# Patient Record
Sex: Female | Born: 1985 | Race: White | Hispanic: No | Marital: Married | Smoking: Current every day smoker
Health system: Southern US, Community
[De-identification: ages and names within clinical notes are randomized; demographics above are authoritative.]

## PROBLEM LIST (undated history)

## (undated) DIAGNOSIS — D649 Anemia, unspecified: Secondary | ICD-10-CM

## (undated) DIAGNOSIS — J45909 Unspecified asthma, uncomplicated: Secondary | ICD-10-CM

## (undated) DIAGNOSIS — I4510 Unspecified right bundle-branch block: Secondary | ICD-10-CM

## (undated) DIAGNOSIS — Q248 Other specified congenital malformations of heart: Secondary | ICD-10-CM

## (undated) DIAGNOSIS — F329 Major depressive disorder, single episode, unspecified: Secondary | ICD-10-CM

## (undated) DIAGNOSIS — I471 Supraventricular tachycardia, unspecified: Secondary | ICD-10-CM

## (undated) DIAGNOSIS — Z8659 Personal history of other mental and behavioral disorders: Secondary | ICD-10-CM

## (undated) DIAGNOSIS — T4145XA Adverse effect of unspecified anesthetic, initial encounter: Secondary | ICD-10-CM

## (undated) DIAGNOSIS — R0600 Dyspnea, unspecified: Secondary | ICD-10-CM

## (undated) DIAGNOSIS — N939 Abnormal uterine and vaginal bleeding, unspecified: Secondary | ICD-10-CM

## (undated) HISTORY — DX: Personal history of other mental and behavioral disorders: Z86.59

## (undated) HISTORY — PX: TUBAL LIGATION: SHX77

## (undated) HISTORY — PX: WISDOM TOOTH EXTRACTION: SHX21

## (undated) HISTORY — DX: Supraventricular tachycardia: I47.1

## (undated) HISTORY — DX: Supraventricular tachycardia, unspecified: I47.10

---
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
## 2004-12-11 ENCOUNTER — Ambulatory Visit (HOSPITAL_COMMUNITY): Admission: RE | Admit: 2004-12-11 | Discharge: 2004-12-11 | Payer: Self-pay | Admitting: Obstetrics and Gynecology

## 2005-01-07 ENCOUNTER — Other Ambulatory Visit: Admission: RE | Admit: 2005-01-07 | Discharge: 2005-01-07 | Payer: Self-pay | Admitting: Obstetrics and Gynecology

## 2005-02-25 ENCOUNTER — Encounter (HOSPITAL_COMMUNITY): Admission: RE | Admit: 2005-02-25 | Discharge: 2005-03-11 | Payer: Self-pay | Admitting: Obstetrics and Gynecology

## 2005-03-18 ENCOUNTER — Encounter (HOSPITAL_COMMUNITY): Admission: RE | Admit: 2005-03-18 | Discharge: 2005-04-17 | Payer: Self-pay | Admitting: Obstetrics and Gynecology

## 2005-04-22 ENCOUNTER — Encounter (HOSPITAL_COMMUNITY): Admission: RE | Admit: 2005-04-22 | Discharge: 2005-05-22 | Payer: Self-pay | Admitting: Obstetrics and Gynecology

## 2005-05-27 ENCOUNTER — Encounter (HOSPITAL_COMMUNITY): Admission: RE | Admit: 2005-05-27 | Discharge: 2005-06-16 | Payer: Self-pay | Admitting: Obstetrics and Gynecology

## 2005-06-17 ENCOUNTER — Inpatient Hospital Stay (HOSPITAL_COMMUNITY): Admission: AD | Admit: 2005-06-17 | Discharge: 2005-06-17 | Payer: Self-pay | Admitting: Obstetrics and Gynecology

## 2005-07-06 ENCOUNTER — Inpatient Hospital Stay (HOSPITAL_COMMUNITY): Admission: AD | Admit: 2005-07-06 | Discharge: 2005-07-08 | Payer: Self-pay | Admitting: Obstetrics and Gynecology

## 2006-11-04 ENCOUNTER — Encounter: Payer: Self-pay | Admitting: Obstetrics & Gynecology

## 2006-11-05 ENCOUNTER — Ambulatory Visit: Payer: Self-pay | Admitting: Cardiology

## 2006-11-05 ENCOUNTER — Inpatient Hospital Stay (HOSPITAL_COMMUNITY): Admission: EM | Admit: 2006-11-05 | Discharge: 2006-11-05 | Payer: Self-pay | Admitting: Internal Medicine

## 2006-12-23 ENCOUNTER — Ambulatory Visit (HOSPITAL_COMMUNITY): Admission: RE | Admit: 2006-12-23 | Discharge: 2006-12-23 | Payer: Self-pay | Admitting: Obstetrics

## 2007-07-05 ENCOUNTER — Inpatient Hospital Stay (HOSPITAL_COMMUNITY): Admission: AD | Admit: 2007-07-05 | Discharge: 2007-07-08 | Payer: Self-pay | Admitting: Obstetrics

## 2007-07-07 ENCOUNTER — Encounter (INDEPENDENT_AMBULATORY_CARE_PROVIDER_SITE_OTHER): Payer: Self-pay | Admitting: Obstetrics

## 2007-07-11 ENCOUNTER — Inpatient Hospital Stay (HOSPITAL_COMMUNITY): Admission: AD | Admit: 2007-07-11 | Discharge: 2007-07-12 | Payer: Self-pay | Admitting: Obstetrics

## 2008-11-08 ENCOUNTER — Emergency Department (HOSPITAL_COMMUNITY): Admission: EM | Admit: 2008-11-08 | Discharge: 2008-11-08 | Payer: Self-pay | Admitting: Emergency Medicine

## 2010-07-08 ENCOUNTER — Encounter: Payer: Self-pay | Admitting: Obstetrics and Gynecology

## 2010-10-30 NOTE — Op Note (Signed)
NAMEDONELLA, PASCARELLA          ACCOUNT NO.:  [id number]   MEDICAL RECORD NO.:  [id number]          PATIENT TYPE:  INP   LOCATION:  9126                          FACILITY:  [facility]   PHYSICIAN:  Kathreen Cosier, M.D.DATE OF BIRTH:  Feb 26, 1986   DATE OF PROCEDURE:  07/07/2007  DATE OF DISCHARGE:                               OPERATIVE REPORT   PREOPERATIVE DIAGNOSIS:  Multiparity.   POSTOPERATIVE DIAGNOSIS:  Multiparity.   PROCEDURE:  Postpartum tubal ligation.   DESCRIPTION OF PROCEDURE:  Under general anesthesia with the patient in  the supine position, the abdomen prepped and draped.  The bladder  emptied with a straight catheter.   Midline subumbilical incision 1 inch long made and carried down to the  fascia.  Fascia cleaned, grasped with two Kochers.  The fascia and the  peritoneum opened with the Mayo scissors.  Left tube grasped in the  midportion with a Babcock clamp.  The tube traced to the fimbria.  A 0  plain suture placed in the mesosalpinx below the portion of tube within  clamp.  This was tied and approximately 1 inch of tube transected.  Hemostasis was satisfactory.  The procedure was done in a similar  fashion on the other side.  Blood loss was minimal.  Abdomen closed in  layers, peritoneum and fascia continuous suture of 0 Dexon and the skin  closed with subcuticular stitch of 4-0 Monocryl.   The patient tolerated the procedure well and was taken to the recovery  room in good condition.           ______________________________  Kathreen Cosier, M.D.     BAM/MEDQ  D:  07/07/2007  T:  07/07/2007  Job:  [id number]

## 2010-10-30 NOTE — H&P (Signed)
Stephanie Peterson, PETREY          ACCOUNT NO.:  [id number]   MEDICAL RECORD NO.:  [id number]          PATIENT TYPE:  INP   LOCATION:  3733                         FACILITY:  [facility]   PHYSICIAN:  Vernice Jefferson, MD          DATE OF BIRTH:  [birth year]   DATE OF ADMISSION:  11/05/2006  DATE OF DISCHARGE:                              HISTORY & PHYSICAL   CHIEF COMPLAINT:  Palpitations and chest pain.   HISTORY OF PRESENT ILLNESS:  The patient is a 25 year old white female  currently pregnant at [redacted] weeks and G4, P2, who has had no prior cardiac  past medical history who states she was today watching TV with her kids.  She had a sudden onset of palpitations, shortness of breath, as well as  some chest pain. The patient felt that her heart was pounding out of her  chest. Came here in the ED at [facility] and found to have a  narrow complex tachycardia with rates about 190 and regular. Rhythm  after 6 mg of adenosine x1 and 12 mg of adenosine x2 finally converted  her to normal sinus rhythm. The patient has never had this complaint  before and no family members with any arrhythmias as well. No early  sudden cardiac death. The patient has never had any dizziness or syncope  recently.   SOCIAL HISTORY:  Negative x3 habits. She is married with 2 kids.   FAMILY HISTORY:  Is reviewed and is noncontributory to patient's current  illness. As above, she has no sudden cardiac death in the family. No  history of any arrhythmias in the family.   MEDICATIONS:  None.   ALLERGIES:  TO PENICILLIN AND MOXIFLOXACIN.   REVIEW OF SYSTEMS:  Negative 11-point review of systems except otherwise  dictated in the above HPI.   PHYSICAL EXAMINATION:  Blood pressure 120s/80s; during her SVT and post  SVT 130/76. Heart rate has ranged from 192 to 111.  GENERAL:  Well-developed, well-nourished, obese, white female in no  acute distress.  HEENT:  Moist mucous membranes. No scleral icterus. No conjunctivae  pallor.  NECK:  Supple. Full range of motion. No jugular venous distention.  CARDIOVASCULAR:  Regular rate and rhythm without rubs, murmurs or  gallops.  CHEST:  Clear to auscultation bilaterally. No wheezes, rales, rhonchi.  ABDOMEN:  Soft, nontender, nondistended. Normal active bowel sounds.  EXTREMITIES:  Trace edema.  NEUROLOGICAL EXAM:  Nonfocal.   EKG on first run demonstrates a narrow complex tachycardia with probable  retrograde T waves. After adenosine, the patient had a quick run of what  was likely aberrancy with a right bundle branch block pattern and then  normal sinus rhythm. Her baseline EKG demonstrates normal sinus rhythm  with a normal PR interval; no delta wave was noted; essentially a normal  ECG.   IMPRESSION:  1. Supraventricular tachycardia, likely AVNRT. No evidence of any      preexcitation on baseline ECG.  2. The patient tolerated this tachycardia pretty well with minimal      drop in blood pressure during  this. I feel she is probably  pretty      stable. However, will admit her just to monitor on telemetry      overnight. We will check a 2-D echocardiogram in the a.m. just to      rule out any structural heart disease, check a TSH as well as just      some basic labs. I have discussed with her OB/GYN about starting a      low-dose beta blocker, and OB/GYN prefer labetalol. We will start      on 100 b.i.d., and the patient will likely be able to be discharged      in the morning. The patient is to follow up with Dr. Graciela Husbands for this      in the a.m.      Vernice Jefferson, MD  Electronically Signed     JT/MEDQ  D:  11/04/2006  T:  11/05/2006  Job:  [id number]

## 2010-10-30 NOTE — Discharge Summary (Signed)
Stephanie Peterson, MILLIKAN          ACCOUNT NO.:  [id number]   MEDICAL RECORD NO.:  [id number]          PATIENT TYPE:  INP   LOCATION:  3733                         FACILITY:  [facility]   PHYSICIAN:  Everardo Beals. Juanda Chance, MD, FACCDATE OF BIRTH:  March 19, 1986   DATE OF ADMISSION:  11/05/2006  DATE OF DISCHARGE:  11/05/2006                               DISCHARGE SUMMARY   PRIMARY CARDIOLOGIST:  Patient is new to [facility].   OB:  [facility] and Gynecology.   PRINCIPAL DIAGNOSIS:  Supraventricular tachycardia.   SECONDARY DIAGNOSES:  Pregnancy (patient thinks approximately six  weeks).   HISTORY OF PRESENT ILLNESS:  Patient is a 25 year old Caucasian female  with no past medical history.  She is gravida 4, para 2 and is currently  six weeks pregnant.  On May 20, she developed palpitations with left-  sided chest discomfort that occurred while watching TV.  Secondary to  persistence of symptoms, she presented to the [facility] ED, where she  was found to be in a narrow complex supraventricular tachycardia at a  rate of 190 beats per minute.  She was treated with 12 mg of adenosine  with restoration of sinus rhythm.  She was admitted for further  evaluation.   HOSPITAL COURSE:  Patient was initiated on labetalol at 100 mg b.i.d.  She has had no additional episodes of SVT; however, her blood pressure  has been running in the high 90s to low 100s.  A 2D echocardiogram was  performed, which revealed an EF of 65%, without regional wall motion  abnormalities, as well as normal aortic and mitral valves.   Also of note, the patient's beta HCG was 914, which was felt to be low  for her stated duration of pregnancy.  A transvaginal ultrasound was  completed and, at this point, showed no evidence of intrauterine or  extrauterine pregnancy with a very small amount of nonspecific pre-  pelvic fluid.  It has been recommended that patient follow up with  obstetrics and gynecology  this week.  She has not yet seen OB-GYN.   DISCHARGE LABORATORY DATA:  Hemoglobin 11.8, hematocrit 35.2, WBC 10.8,  platelets 255, MCV 88.  Sodium 139, potassium 3.6, chloride 111, CO2 23,  BUN 9, creatinine 0.56, glucose 93.  Total bilirubin 0.4, alkaline  phosphatase 37, AST 20, ALT 22, albumin 2.8, calcium 8.5.  Urinalysis  was negative.  TSH is currently pending.  Beta HCG was 914.   DISPOSITION:  Patient is being discharged home today, in good condition.   FOLLOWUP PLANS AND APPOINTMENTS:  [name] has been recommended that  she follow up with obstetrics and gynecology this week.  We have offered  a followup appointment with [facility].  However, because she  does not have insurance, she would prefer to just contact us p.r.n.   DISCHARGE MEDICATIONS:  None at this time, as the patient has had only  one episode of SVT and has had no prior episodes of SVT with any other  pregnancies, and also considering that she is experiencing low blood  pressures with beta blocker therapy, we will hold off  on any beta  blocker therapy at this point.  If she were to have recurrent symptoms,  we would then have to reconsider.   OUTSTANDING LABORATORY STUDIES:  TSH is pending.   DURATION OF DISCHARGE ENCOUNTER:  Forty-five minutes, including  physician time.      Stephanie Peterson, ANP      Bruce R. Juanda Chance, MD, [facility]  Electronically Signed    CB/MEDQ  D:  11/05/2006  T:  11/05/2006  Job:  [id number]   cc:   and Gynecology [facility]

## 2010-11-02 NOTE — H&P (Signed)
Stephanie Peterson, Stephanie Peterson          ACCOUNT NO.:  [id number]   MEDICAL RECORD NO.:  [id number]          PATIENT TYPE:  INP   LOCATION:  9163                          FACILITY:  [facility]   PHYSICIAN:  Osborn Coho, M.D.   DATE OF BIRTH:  [birth year]   DATE OF ADMISSION:  07/06/2005  DATE OF DISCHARGE:                                HISTORY & PHYSICAL   HISTORY OF PRESENT ILLNESS:  This is a 25 year old gravida 3, para 0, 1, 1,  1 at 36-5/7 weeks who presents with complaints of gush of clear fluid at  12:45 (midnight) with onset of contractions after that. Cervix was 2 cm in  the office earlier today (followed by the nurse midwife service and  remarkable for:  1.  First trimester spotting.  2.  Abnormal Pap with cryosurgery.  3.  History of asthma.  4.  History of preterm delivery at 34 weeks.  5.  Previous smoker.  6.  Obesity.  7.  Polyhydramnios.  8.  Group B strep negative.   ALLERGIES:  PENICILLIN AND AMOXICILLIN.   OBSTETRIC HISTORY:  The patient had a vaginal delivery in 2004 of a female  infant at 34-weeks gestation, weighing 5 pounds, 3 ounces. Remarkable for  rapid labor and preterm labor. The patient was on bedrest for one month  prior to delivery. She had a spontaneous abortion in 2005 in the first  trimester.   PAST MEDICAL HISTORY:  1.  Remarkable for anemia.  2.  History of postpartum depression.  3.  History of abnormal Pap with cryosurgery.  4.  History of childhood varicella.  5.  History of asthma diagnosed as an infant.  6.  History of kidney infection in 2004.  7.  History of depression.   FAMILY HISTORY:  Remarkable for a grandmother and grandfather with heart  disease. Grandfather with hypertension. Mother with varicose veins and blood  clots. Grandfather with emphysema. Grandmother with thyroid problems. Two  brothers with seizure disorder, and a mother with migraines. Grandfather  with cancer.   PAST SURGICAL HISTORY:  Remarkable for cryosurgery.   GENETIC HISTORY:  Remarkable for father of the baby who is a twin.   SOCIAL HISTORY:  The patient is married to Hannah Beat who is involved  and supportive. She does not report a religious affiliation. She denies any  alcohol, tobacco or drug use.   PRENATAL LABS:  Hemoglobin 10.8, platelets 242,000. Blood type A positive,  antibody screen negative.  RPR nonreactive. Rubella immune. Hepatitis negative. HIV negative.  Gonorrheal negative, Chlamydia negative.   HISTORY OF CURRENT PREGNANCY:  The patient entered care at [redacted] weeks  gestation. She had an ultrasound for cervical length in early part of  pregnancy and Delalutin was discussed and was begun after 16 weeks. She had  an anatomy scan at 18 weeks which was normal. She had a Glucola at 22 and at  26 weeks which were normal. She had some abdominal cramping at 26 weeks and  fetal fibromectomy was done which was found to be negative. She had another  ultrasound at 28 weeks which showed elevated amniotic fluid index. 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 She had several more ultrasounds which measured polyhydramnios.  She had a 3-hour GTT due to heavy polyhydramnios and it was normal. She had  a group B strep negative at term.   PHYSICAL EXAMINATION:  VITAL SIGNS:  Stable, afebrile.  HEENT:  Within normal limits.  NECK:  Thyroid normal, not enlarged.  CHEST:  Clear to auscultation.  HEART:  Regular rate and rhythm.  ABDOMEN:  Gravid. Vertex per Leopold's. EFM. She has reassuring fetal heart  rate in the 140's with contractions every 2 to 4 minutes. Clear fluid is  leaking from the vagina.  CERVIX:  Is 2 to 3 cm, 90% effaced, -1 station.  EXTREMITIES:  Within normal limits.   ASSESSMENT:  1.  Intrauterine pregnancy at 36-5/7 weeks.  2.  Premature rupture of membranes.  3.  Early active labor.   PLAN:  1.  Admit to birthing suite.  2.  Routine CNM orders.  3.  Patient  declines analgesia.      Marie L. Williams, C.N.M.      Osborn Coho, M.D.  Electronically Signed    MLW/MEDQ  D:  07/06/2005  T:  07/06/2005  Job:  [id number]

## 2011-01-29 ENCOUNTER — Emergency Department (HOSPITAL_COMMUNITY)
Admission: EM | Admit: 2011-01-29 | Discharge: 2011-01-29 | Discharge status: Against Medical Advice | Payer: Self-pay | Attending: Emergency Medicine | Admitting: Emergency Medicine

## 2011-01-29 DIAGNOSIS — R109 Unspecified abdominal pain: Secondary | ICD-10-CM | POA: Insufficient documentation

## 2011-01-29 DIAGNOSIS — R110 Nausea: Secondary | ICD-10-CM | POA: Insufficient documentation

## 2011-03-07 LAB — CBC
HCT: 34.5 — ABNORMAL LOW
HCT: 37.2
Hemoglobin: 11.8 — ABNORMAL LOW
MCHC: 34.3
MCV: 87.5
Platelets: 235
RBC: 4.31
RDW: 13.9
WBC: 13.1 — ABNORMAL HIGH

## 2011-03-07 LAB — RPR: RPR Ser Ql: NONREACTIVE

## 2011-03-21 ENCOUNTER — Emergency Department (HOSPITAL_COMMUNITY): Payer: Self-pay

## 2011-03-21 ENCOUNTER — Emergency Department (HOSPITAL_COMMUNITY)
Admission: EM | Admit: 2011-03-21 | Discharge: 2011-03-22 | Disposition: A | Discharge status: Home / Self Care | Payer: Self-pay | Attending: Emergency Medicine | Admitting: Emergency Medicine

## 2011-03-21 DIAGNOSIS — R0609 Other forms of dyspnea: Secondary | ICD-10-CM | POA: Insufficient documentation

## 2011-03-21 DIAGNOSIS — J45909 Unspecified asthma, uncomplicated: Secondary | ICD-10-CM | POA: Insufficient documentation

## 2011-03-21 DIAGNOSIS — R0602 Shortness of breath: Secondary | ICD-10-CM | POA: Insufficient documentation

## 2011-03-21 DIAGNOSIS — R079 Chest pain, unspecified: Secondary | ICD-10-CM | POA: Insufficient documentation

## 2011-03-21 DIAGNOSIS — I498 Other specified cardiac arrhythmias: Secondary | ICD-10-CM | POA: Insufficient documentation

## 2011-03-21 DIAGNOSIS — R0989 Other specified symptoms and signs involving the circulatory and respiratory systems: Secondary | ICD-10-CM | POA: Insufficient documentation

## 2011-03-21 LAB — COMPREHENSIVE METABOLIC PANEL
AST: 17 U/L (ref 0–37)
Albumin: 3.5 g/dL (ref 3.5–5.2)
Alkaline Phosphatase: 54 U/L (ref 39–117)
BUN: 7 mg/dL (ref 6–23)
CO2: 27 mEq/L (ref 19–32)
Calcium: 9 mg/dL (ref 8.4–10.5)
Creatinine, Ser: 0.71 mg/dL (ref 0.50–1.10)
GFR calc Af Amer: >90 mL/min (ref >90)
Glucose, Bld: 123 mg/dL — ABNORMAL HIGH (ref 70–99)
Potassium: 3.6 mEq/L (ref 3.5–5.1)
Total Bilirubin: 0.2 mg/dL — ABNORMAL LOW (ref 0.3–1.2)
Total Protein: 6.7 g/dL (ref 6.0–8.3)

## 2011-03-21 LAB — URINALYSIS, ROUTINE W REFLEX MICROSCOPIC
Nitrite: NEGATIVE
Protein, ur: NEGATIVE mg/dL
Urobilinogen, UA: 0.2 mg/dL (ref 0.0–1.0)

## 2011-03-21 LAB — DIFFERENTIAL
Basophils Relative: 0 % (ref 0–1)
Eosinophils Absolute: 0.3 10*3/uL (ref 0.0–0.7)
Eosinophils Relative: 2 % (ref 0–5)
Lymphocytes Relative: 32 % (ref 12–46)
Lymphs Abs: 4 10*3/uL (ref 0.7–4.0)
Monocytes Absolute: 0.6 10*3/uL (ref 0.1–1.0)
Monocytes Relative: 5 % (ref 3–12)
Neutrophils Relative %: 60 % (ref 43–77)

## 2011-03-21 LAB — CBC
MCH: 30.5 pg (ref 26.0–34.0)
WBC: 12.5 10*3/uL — ABNORMAL HIGH (ref 4.0–10.5)

## 2011-03-21 LAB — RAPID URINE DRUG SCREEN, HOSP PERFORMED
Barbiturates: NOT DETECTED
Benzodiazepines: NOT DETECTED
Opiates: NOT DETECTED

## 2011-03-21 LAB — CK TOTAL AND CKMB (NOT AT ARMC): CK, MB: 2.5 ng/mL (ref 0.3–4.0)

## 2011-03-21 LAB — POCT I-STAT, CHEM 8
Calcium, Ion: 1.16 mmol/L (ref 1.12–1.32)
Chloride: 104 mEq/L (ref 96–112)

## 2011-03-21 LAB — POCT I-STAT TROPONIN I: Troponin i, poc: 0.02 ng/mL (ref 0.00–0.08)

## 2011-03-21 LAB — URINE MICROSCOPIC-ADD ON

## 2011-03-21 MED ORDER — IOHEXOL 300 MG/ML  SOLN
100.0000 mL | Freq: Once | INTRAMUSCULAR | Status: AC | PRN
  Administered 2011-03-21: 100 mL via INTRAVENOUS

## 2011-03-22 LAB — POCT PREGNANCY, URINE: Preg Test, Ur: NEGATIVE

## 2011-07-16 ENCOUNTER — Emergency Department (HOSPITAL_COMMUNITY)
Admission: EM | Admit: 2011-07-16 | Discharge: 2011-07-16 | Disposition: A | Discharge status: Home / Self Care | Payer: Self-pay | Attending: Emergency Medicine | Admitting: Emergency Medicine

## 2011-07-16 ENCOUNTER — Encounter (HOSPITAL_COMMUNITY): Payer: Self-pay | Admitting: *Deleted

## 2011-07-16 DIAGNOSIS — IMO0002 Reserved for concepts with insufficient information to code with codable children: Secondary | ICD-10-CM | POA: Insufficient documentation

## 2011-07-16 DIAGNOSIS — M7989 Other specified soft tissue disorders: Secondary | ICD-10-CM | POA: Insufficient documentation

## 2011-07-16 DIAGNOSIS — R21 Rash and other nonspecific skin eruption: Secondary | ICD-10-CM | POA: Insufficient documentation

## 2011-07-16 DIAGNOSIS — M79609 Pain in unspecified limb: Secondary | ICD-10-CM | POA: Insufficient documentation

## 2011-07-16 DIAGNOSIS — L03113 Cellulitis of right upper limb: Secondary | ICD-10-CM

## 2011-07-16 HISTORY — DX: Supraventricular tachycardia: I47.1

## 2011-07-16 MED ORDER — HYDROXYZINE HCL 25 MG PO TABS: 25.0000 mg | ORAL_TABLET | Freq: Four times a day (QID) | ORAL | Status: AC

## 2011-07-16 MED ORDER — CEPHALEXIN 500 MG PO CAPS: 500.0000 mg | ORAL_CAPSULE | Freq: Four times a day (QID) | ORAL | Status: DC

## 2011-07-16 MED ORDER — CEPHALEXIN 500 MG PO CAPS: 500.0000 mg | ORAL_CAPSULE | Freq: Four times a day (QID) | ORAL | Status: AC

## 2011-07-16 NOTE — ED Provider Notes (Signed)
Medical screening examination/treatment/procedure(s) were performed by non-physician practitioner and as supervising physician I was immediately available for consultation/collaboration. Devoria Albe, MD, [facility]   Ward Givens, MD 07/16/11 [phone]

## 2011-07-16 NOTE — ED Provider Notes (Signed)
History     CSN: [id number]  Arrival date & time 07/16/11  4098   First MD Initiated Contact with Patient 07/16/11 [phone]      Chief Complaint  Patient presents with  . Cellulitis    (Consider location/radiation/quality/duration/timing/severity/associated sxs/prior treatment) Patient is a 26 y.o. female presenting with rash. The history is provided by the patient.  Rash  This is a new problem.  Pt states she got a tattoo 5 days ago on the right upper arm. Since then redness and swelling to the tattooed area. Never had a tatoo before. States it is draining yellow drainage. Putting bacitracin on with no relief. Area is painful. No itching.                 Past Medical History  Diagnosis Date  . SVT (supraventricular tachycardia)     Past Surgical History  Procedure Date  . Tubal ligation     No family history on file.  History  Substance Use Topics  . Smoking status: Current Everyday Smoker  . Smokeless tobacco: Not on file  . Alcohol Use: No    OB History    Grav Para Term Preterm Abortions TAB SAB Ect Mult Living                  Review of Systems  Constitutional: Negative for fever and chills.  HENT: Negative.   Eyes: Negative.   Respiratory: Negative.   Cardiovascular: Negative.   Gastrointestinal: Negative.   Genitourinary: Negative.   Musculoskeletal: Negative.   Skin: Positive for color change and rash.  Neurological: Negative.   Psychiatric/Behavioral: Negative.     Allergies  Amoxicillin and Penicillins  Home Medications  No current outpatient prescriptions on file.  BP 124/61  Pulse 94  Temp(Src) 98.1 F (36.7 C) (Oral)  Resp 18  Ht 5\' 6"  (1.676 m)  Wt 198 lb (89.812 kg)  BMI 31.96 kg/m2  SpO2 96%  Physical Exam  Nursing note and vitals reviewed. Constitutional: She appears well-developed and well-nourished. No distress.  HENT:  Head: Normocephalic.  Eyes: Conjunctivae are normal.  Neck: Neck supple.  Cardiovascular: Normal rate,  regular rhythm and normal heart sounds.   Pulmonary/Chest: Effort normal and breath sounds normal. No respiratory distress.  Musculoskeletal: Normal range of motion.  Skin: Skin is warm and dry.       Large multi colored tattoo to the right upper arm. Central erythema with mild skin scabbing and yellowish discoloration. Tender to palpation. No drainage noted. Surrounding small papules with no surrounding erythema.  Psychiatric: She has a normal mood and affect.    ED Course  Procedures (including critical care time)  Right upper arm tattoo appears infected with possible allergic reaction. Will start on antibitoic and anti histamine with follow up.  No diagnosis found.    MDM         Lottie Mussel, PA 07/16/11 1034

## 2011-07-16 NOTE — ED Notes (Signed)
Pt presents to department for evaluation of possible infection to R upper arm. States she received new tattoo on Tuesday, now site is oozing yellow/green colored drainage. states swelling and increased pain to area. Pt states "I am prone to infection." denies fever. 7/10 pain at the time. She is alert and oriented x4. No signs of distress at present.

## 2011-07-16 NOTE — ED Notes (Signed)
Pt discharged home. Had no further questions. VSS

## 2011-07-16 NOTE — ED Notes (Signed)
Patient had a new tattoo placed on her right upper arm,  Thursday evening.  She noted onset of green/yellow colored drainage  Coming from the tattoo x 2 days.  Patient has tried otc antibiotic ointment w/o relief

## 2012-01-08 ENCOUNTER — Emergency Department (HOSPITAL_COMMUNITY)
Admission: EM | Admit: 2012-01-08 | Discharge: 2012-01-09 | Disposition: A | Discharge status: Home / Self Care | Payer: Self-pay | Attending: Emergency Medicine | Admitting: Emergency Medicine

## 2012-01-08 ENCOUNTER — Encounter (HOSPITAL_COMMUNITY): Payer: Self-pay | Admitting: Emergency Medicine

## 2012-01-08 DIAGNOSIS — M545 Low back pain, unspecified: Secondary | ICD-10-CM | POA: Insufficient documentation

## 2012-01-08 DIAGNOSIS — Z880 Allergy status to penicillin: Secondary | ICD-10-CM | POA: Insufficient documentation

## 2012-01-08 DIAGNOSIS — M549 Dorsalgia, unspecified: Secondary | ICD-10-CM

## 2012-01-08 DIAGNOSIS — F17200 Nicotine dependence, unspecified, uncomplicated: Secondary | ICD-10-CM | POA: Insufficient documentation

## 2012-01-08 NOTE — ED Notes (Signed)
Pt alert, nad, c/o low back pain, onset was several months ago, denies recent trauma or injury, ambulates to triage, steady gait noted

## 2012-01-09 MED ORDER — METHOCARBAMOL 500 MG PO TABS: 500.0000 mg | ORAL_TABLET | Freq: Two times a day (BID) | ORAL | Status: AC

## 2012-01-09 MED ORDER — OXYCODONE-ACETAMINOPHEN 5-325 MG PO TABS
2.0000 | ORAL_TABLET | Freq: Once | ORAL | Status: AC
  Administered 2012-01-09: 2 via ORAL

## 2012-01-09 MED ORDER — OXYCODONE-ACETAMINOPHEN 5-325 MG PO TABS
ORAL_TABLET | ORAL | Status: AC
  Filled 2012-01-09: qty 2

## 2012-01-09 MED ORDER — NAPROXEN 500 MG PO TABS: 500.0000 mg | ORAL_TABLET | Freq: Two times a day (BID) | ORAL | Status: AC
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
## 2012-01-09 NOTE — ED Provider Notes (Signed)
Medical screening examination/treatment/procedure(s) were performed by non-physician practitioner and as supervising physician I was immediately available for consultation/collaboration.   Darsi Tien L Shadaya Marschner, MD 01/09/12 0410 

## 2013-03-06 ENCOUNTER — Emergency Department (HOSPITAL_COMMUNITY)
Admission: EM | Admit: 2013-03-06 | Discharge: 2013-03-07 | Disposition: A | Discharge status: Home / Self Care | Payer: Self-pay | Attending: Emergency Medicine | Admitting: Emergency Medicine

## 2013-03-06 ENCOUNTER — Encounter (HOSPITAL_COMMUNITY): Payer: Self-pay | Admitting: *Deleted

## 2013-03-06 ENCOUNTER — Emergency Department (HOSPITAL_COMMUNITY): Payer: Self-pay

## 2013-03-06 DIAGNOSIS — Z8679 Personal history of other diseases of the circulatory system: Secondary | ICD-10-CM | POA: Insufficient documentation

## 2013-03-06 DIAGNOSIS — R109 Unspecified abdominal pain: Secondary | ICD-10-CM | POA: Insufficient documentation

## 2013-03-06 DIAGNOSIS — F17200 Nicotine dependence, unspecified, uncomplicated: Secondary | ICD-10-CM | POA: Insufficient documentation

## 2013-03-06 DIAGNOSIS — Z3202 Encounter for pregnancy test, result negative: Secondary | ICD-10-CM | POA: Insufficient documentation

## 2013-03-06 DIAGNOSIS — Z880 Allergy status to penicillin: Secondary | ICD-10-CM | POA: Insufficient documentation

## 2013-03-06 DIAGNOSIS — Z9851 Tubal ligation status: Secondary | ICD-10-CM | POA: Insufficient documentation

## 2013-03-06 DIAGNOSIS — R319 Hematuria, unspecified: Secondary | ICD-10-CM | POA: Insufficient documentation

## 2013-03-06 LAB — URINALYSIS, ROUTINE W REFLEX MICROSCOPIC
Glucose, UA: NEGATIVE mg/dL
Ketones, ur: NEGATIVE mg/dL
Protein, ur: NEGATIVE mg/dL

## 2013-03-06 LAB — CBC WITH DIFFERENTIAL/PLATELET
Hemoglobin: 14.4 g/dL (ref 12.0–15.0)
Lymphs Abs: 3.9 10*3/uL (ref 0.7–4.0)
Monocytes Relative: 7 % (ref 3–12)
Neutro Abs: 7.4 10*3/uL (ref 1.7–7.7)
Neutrophils Relative %: 59 % (ref 43–77)
RBC: 4.65 MIL/uL (ref 3.87–5.11)

## 2013-03-06 LAB — COMPREHENSIVE METABOLIC PANEL
Albumin: 3.5 g/dL (ref 3.5–5.2)
Alkaline Phosphatase: 67 U/L (ref 39–117)
BUN: 13 mg/dL (ref 6–23)
CO2: 25 mEq/L (ref 19–32)
Chloride: 94 mEq/L — ABNORMAL LOW (ref 96–112)
GFR calc Af Amer: 84 mL/min — ABNORMAL LOW (ref >90)
GFR calc non Af Amer: 72 mL/min — ABNORMAL LOW (ref >90)
Glucose, Bld: 88 mg/dL (ref 70–99)
Potassium: 2.9 mEq/L — ABNORMAL LOW (ref 3.5–5.1)
Total Bilirubin: 0.6 mg/dL (ref 0.3–1.2)

## 2013-03-06 LAB — WET PREP, GENITAL
WBC, Wet Prep HPF POC: NONE SEEN
Yeast Wet Prep HPF POC: NONE SEEN

## 2013-03-06 MED ORDER — POTASSIUM CHLORIDE CRYS ER 20 MEQ PO TBCR
40.0000 meq | EXTENDED_RELEASE_TABLET | Freq: Once | ORAL | Status: AC
  Administered 2013-03-06: 40 meq via ORAL
  Filled 2013-03-06: qty 2

## 2013-03-06 MED ORDER — FENTANYL CITRATE 0.05 MG/ML IJ SOLN
50.0000 ug | Freq: Once | INTRAMUSCULAR | Status: AC
  Administered 2013-03-06: 50 ug via INTRAVENOUS
  Filled 2013-03-06: qty 2

## 2013-03-06 MED ORDER — OXYCODONE-ACETAMINOPHEN 5-325 MG PO TABS: 1.0000 | ORAL_TABLET | ORAL | Status: DC | PRN

## 2013-03-06 MED ORDER — HYDROMORPHONE HCL PF 1 MG/ML IJ SOLN
1.0000 mg | Freq: Once | INTRAMUSCULAR | Status: AC
  Administered 2013-03-06: 1 mg via INTRAVENOUS
  Filled 2013-03-06: qty 1

## 2013-03-06 MED ORDER — CIPROFLOXACIN HCL 500 MG PO TABS: 500.0000 mg | ORAL_TABLET | Freq: Two times a day (BID) | ORAL | Status: DC

## 2013-03-06 NOTE — ED Provider Notes (Signed)
CSN: [id number]     Arrival date & time 03/06/13  1802 History   First MD Initiated Contact with Patient 03/06/13 1839     Chief Complaint  Patient presents with  . Abdominal Pain   (Consider location/radiation/quality/duration/timing/severity/associated sxs/prior Treatment) Patient is a 27 y.o. female presenting with abdominal pain.  Abdominal Pain  Pt reports about 5 days of pain in lower abdomen, mostly suprapubic, associated with pain while urinating and for a short time afterward. She has also noticed some blood in urine the last 2 days but no vaginal bleeding or discharge. She had several days of Clindamycin remaining from a previous prescription which she though was for UTI but she can't remember. She denies any fever or vomiting. Mild lower back pain.  She also reports several boils in various areas of her body, mostly in skin folds such as axillae, abdomen etc ongoing for months, comes and goes. Nothing particularly new now.   Past Medical History  Diagnosis Date  . SVT (supraventricular tachycardia)    Past Surgical History  Procedure Laterality Date  . Tubal ligation     No family history on file. History  Substance Use Topics  . Smoking status: Current Every Day Smoker  . Smokeless tobacco: Not on file  . Alcohol Use: No   OB History   Grav Para Term Preterm Abortions TAB SAB Ect Mult Living                 Review of Systems  Gastrointestinal: Positive for abdominal pain.   All other systems reviewed and are negative except as noted in HPI.   Allergies  Amoxicillin and Penicillins  Home Medications   Current Outpatient Rx  Name  Route  Sig  Dispense  Refill  . ibuprofen (ADVIL,MOTRIN) 200 MG tablet   Oral   Take 800 mg by mouth every 6 (six) hours as needed for pain.          BP 135/71  Pulse 93  Temp(Src) 98.6 F (37 C) (Oral)  Resp 20  SpO2 97%  LMP 03/15/2011 Physical Exam  Nursing note and vitals reviewed. Constitutional: She is oriented  to person, place, and time. She appears well-developed and well-nourished.  HENT:  Head: Normocephalic and atraumatic.  Eyes: EOM are normal. Pupils are equal, round, and reactive to light.  Neck: Normal range of motion. Neck supple.  Cardiovascular: Normal rate, normal heart sounds and intact distal pulses.   Pulmonary/Chest: Effort normal and breath sounds normal.  Abdominal: Bowel sounds are normal. She exhibits no distension. There is tenderness (mild suprapubic). There is no rebound and no guarding.  Musculoskeletal: Normal range of motion. She exhibits no edema and no tenderness.  Neurological: She is alert and oriented to person, place, and time. She has normal strength. No cranial nerve deficit or sensory deficit.  Skin: Skin is warm and dry. No rash noted.  Psychiatric: She has a normal mood and affect.    ED Course  Procedures (including critical care time) Labs Review Labs Reviewed  CBC WITH DIFFERENTIAL - Abnormal; Notable for the following:    WBC 12.5 (*)    All other components within normal limits  COMPREHENSIVE METABOLIC PANEL - Abnormal; Notable for the following:    Sodium 133 (*)    Potassium 2.9 (*)    Chloride 94 (*)    GFR calc non Af Amer 72 (*)    GFR calc Af Amer 84 (*)    All other components within normal  limits  URINALYSIS, ROUTINE W REFLEX MICROSCOPIC - Abnormal; Notable for the following:    APPearance HAZY (*)    Hgb urine dipstick LARGE (*)    Leukocytes, UA SMALL (*)    All other components within normal limits  WET PREP, GENITAL  GC/CHLAMYDIA PROBE AMP  URINE MICROSCOPIC-ADD ON  POCT PREGNANCY, URINE   Imaging Review No results found.  MDM  No diagnosis found.  UA with some blood but no signs of infection. Pelvic exam done, no discharge, no CMT but moderate R adnexal tenderness. No adnexal masses. Will send for [place].   Care signed out at the change of shift pending [place]. K replaced.   Nathifa Ritthaler B. Bernette Mayers, MD 03/06/13 2241

## 2013-03-06 NOTE — ED Notes (Signed)
The  Pt has had painful urination back and abd pain for 5 days.  She has taken clindamycin po without relief.  lmp 2weeks ago.  She also has bloody urine

## 2013-03-06 NOTE — ED Provider Notes (Signed)
Pt stable and feels improved US findings noted.  I spoke to radiology dr Mayford Knife, and she reports there appears to be doppler flow so less likely torsion Abdominal exam is unremarkable at this time She reports persistent dysuria.  Will start her on antibiotics Stable for d/c   Joya Gaskins, MD 03/06/13 [id number]

## 2013-03-06 NOTE — ED Notes (Signed)
Unable to discharge pt at this time, protocol to wait 30 minutes to discharge pt after given pain medication.

## 2013-03-07 LAB — GC/CHLAMYDIA PROBE AMP: CT Probe RNA: NEGATIVE

## 2013-03-09 LAB — URINE CULTURE: Colony Count: 10000

## 2013-08-06 IMAGING — CT CT ANGIO CHEST
2 of 6 series · 19 of 46 positions shown · IV contrast (omnipaque)
Comparison: None

CLINICAL DATA: Left-sided chest pain.  Rule out pulmonary embolus.
History of asthma.

CT ANGIOGRAPHY CHEST WITH CONTRAST
TECHNIQUE: Multidetector CT imaging of the chest was performed
using the standard protocol during bolus administration of
intravenous contrast.  Multiplanar CT image reconstructions
including MIPs were obtained to evaluate the vascular anatomy.
Contrast: 100mL OMNIPAQUE IOHEXOL 300 MG/ML IV SOLN

[Series 10: pulm embolism 1.0 b25f thin · axial · 0.76mm/px · z∈[-418,-174]mm · 16 of 269 slices shown]
[im 12/269  lung]
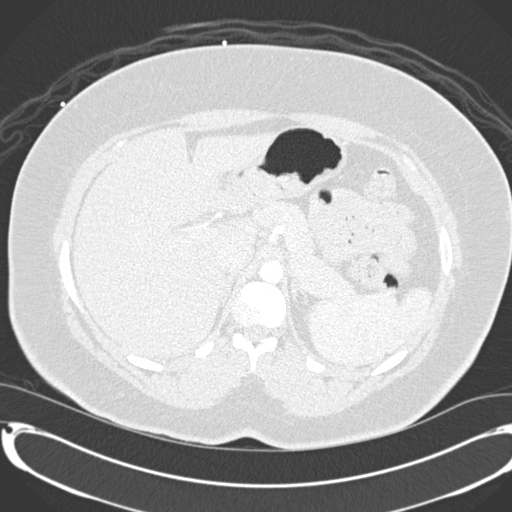
[im 35/269  soft-tissue]
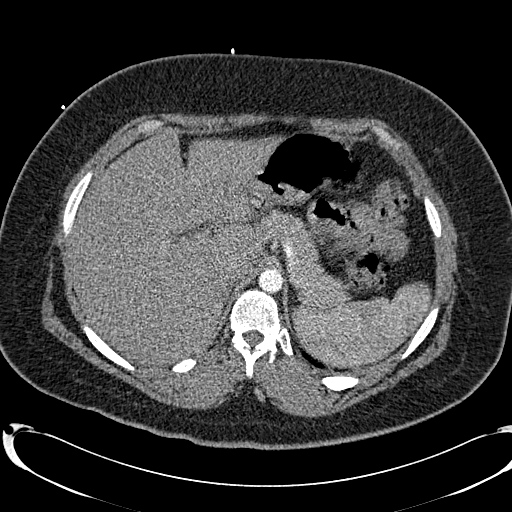
[im 47/269  lung]
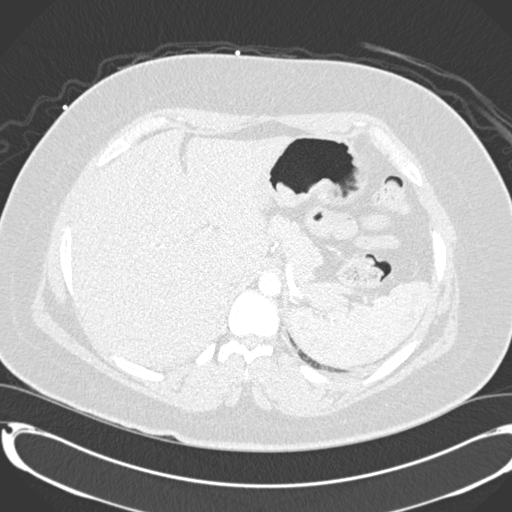
[im 59/269  soft-tissue]
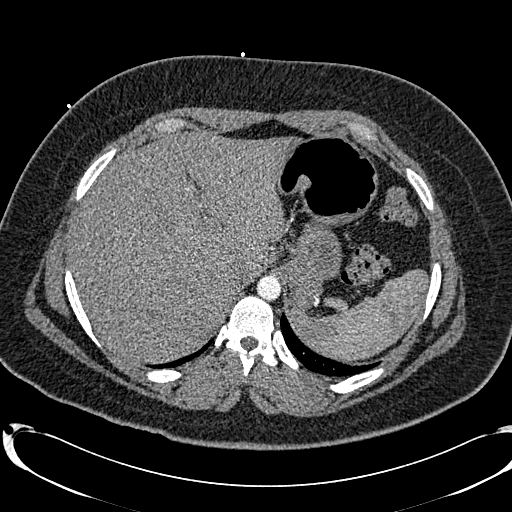
[im 82/269  lung]
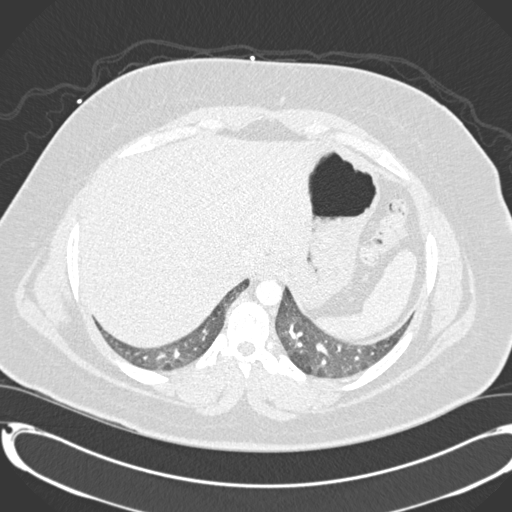
[im 94/269  soft-tissue]
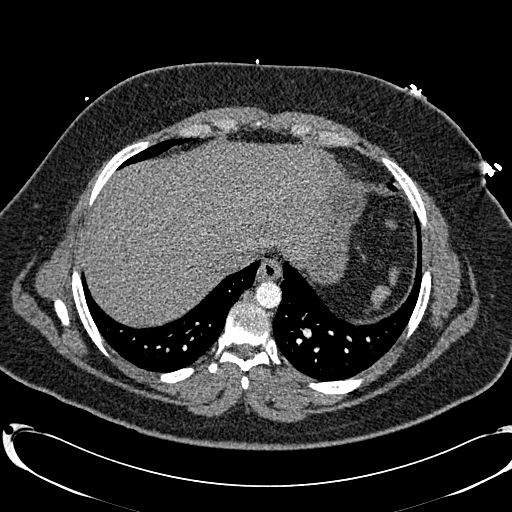
[im 105/269  lung]
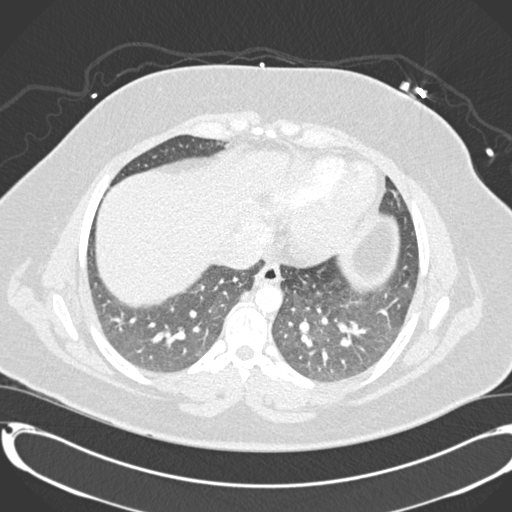
[im 129/269  soft-tissue]
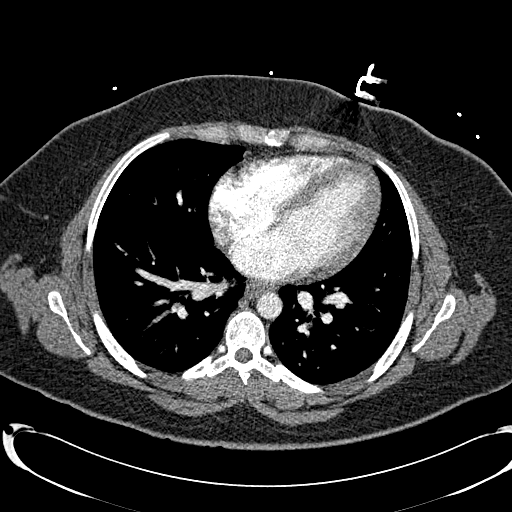
[im 140/269  lung]
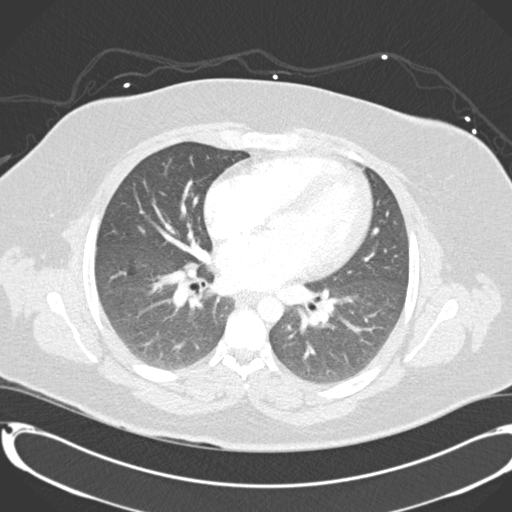
[im 164/269  soft-tissue]
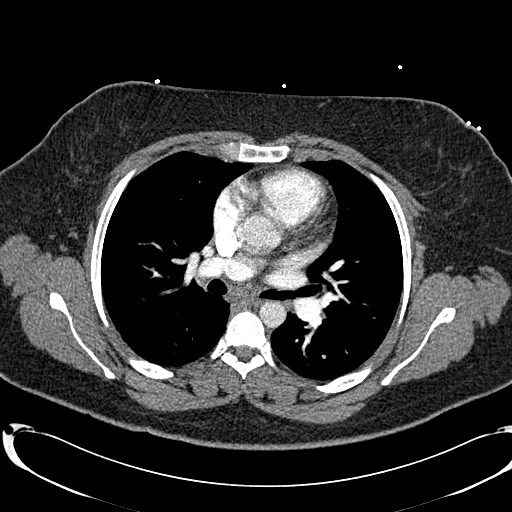
[im 175/269  lung]
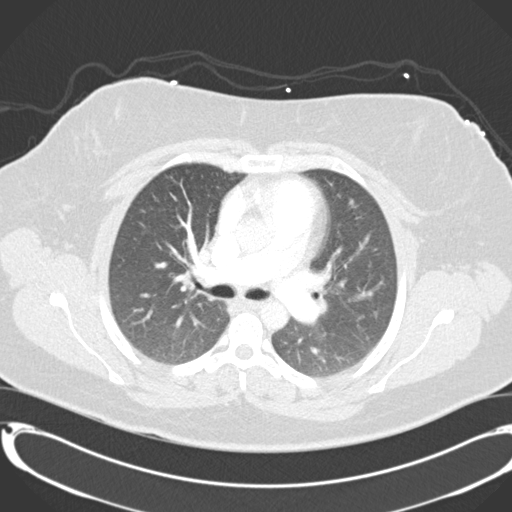
[im 187/269  soft-tissue]
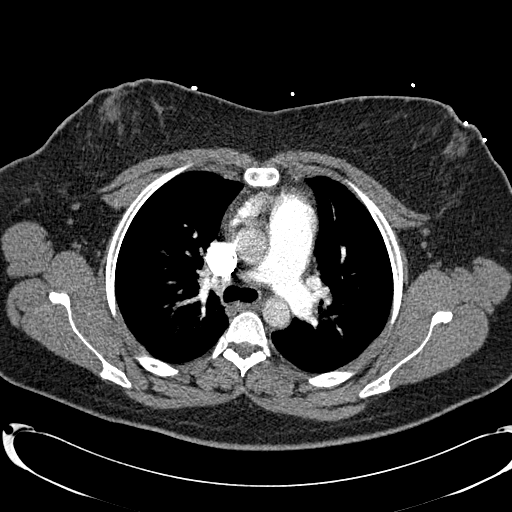
[im 210/269  lung]
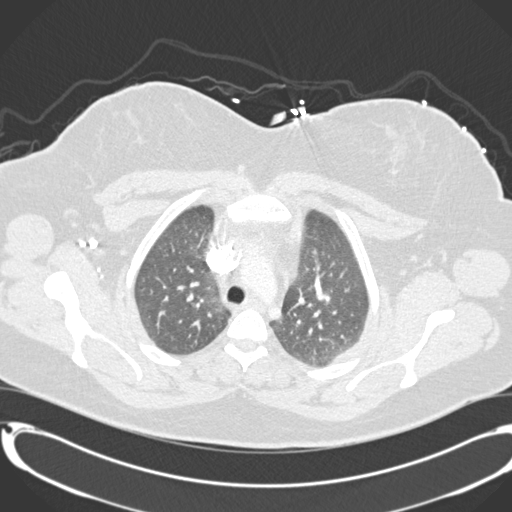
[im 222/269  soft-tissue]
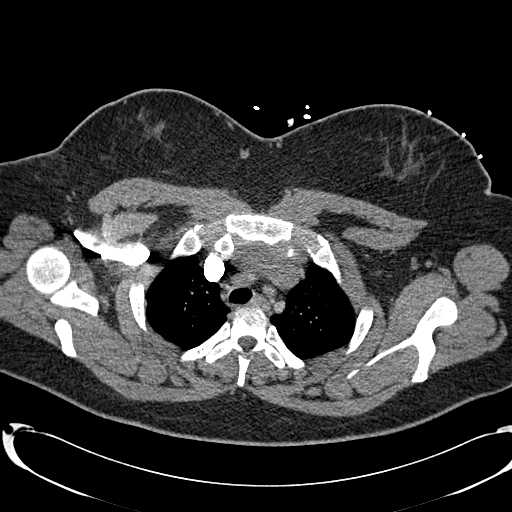
[im 234/269  lung]
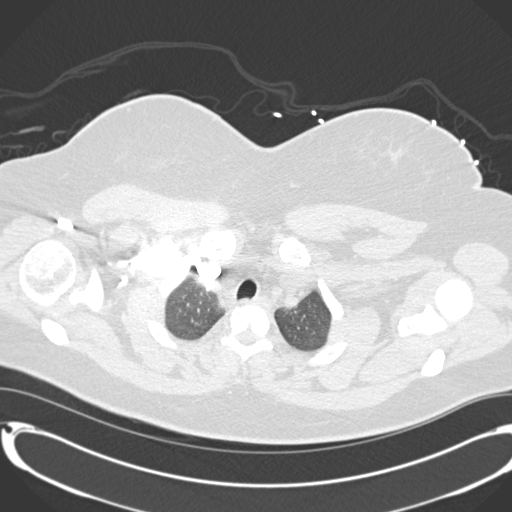
[im 257/269  soft-tissue]
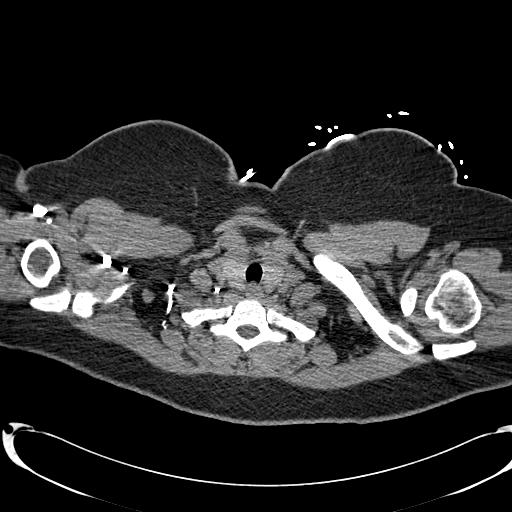

[Series 602: coronal · coronal · 0.76mm/px · 3 of 103 slices shown]
[im 26/103  soft-tissue]
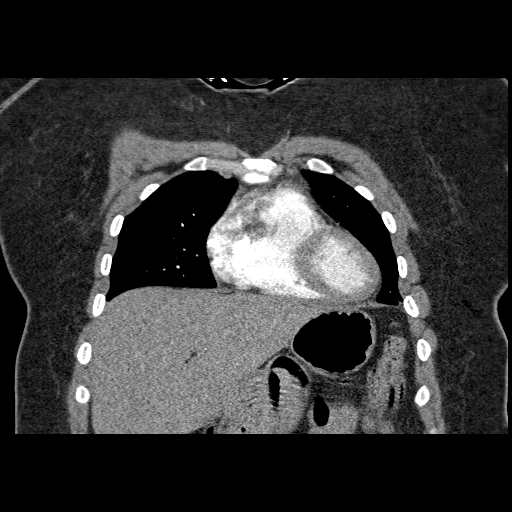
[im 52/103  soft-tissue]
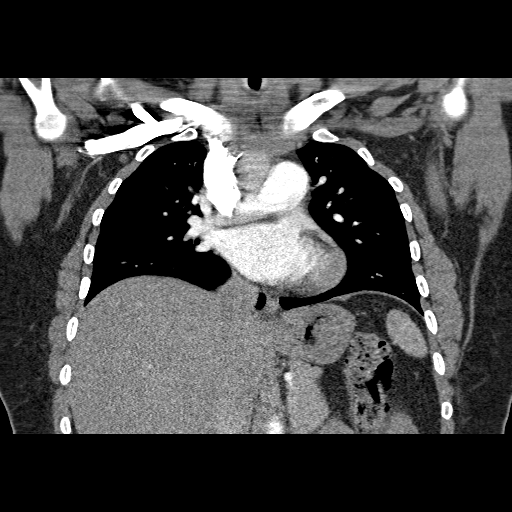
[im 77/103  soft-tissue]
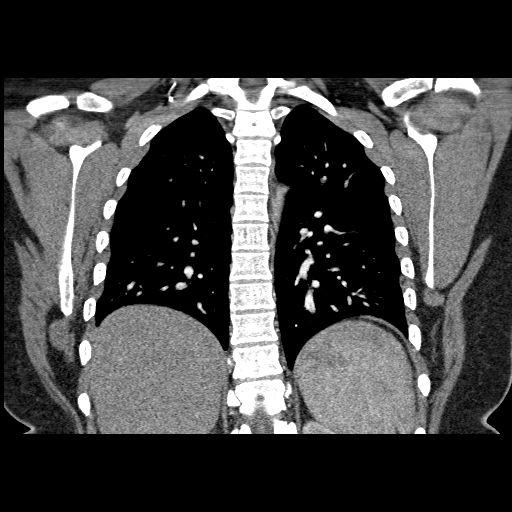

[19 of 46 positions shown; findings below may reference images not displayed]

FINDINGS: Technically adequate study with good opacification of
the central and segmental pulmonary arteries.  No focal filling
defects demonstrated.  No evidence of significant pulmonary
embolus.  Normal caliber unopacified thoracic aorta.  No pleural
effusions.  Visualized upper abdominal structures are unremarkable.
Dependent changes and respiratory motion artifact in the lungs.  No
focal airspace consolidation or interstitial changes.  Airways
appear patent.  Normal alignment of the thoracic spine.  No
depressed sternal fractures.

Review of the MIP images confirms the above findings.
IMPRESSION: No evidence of significant pulmonary embolus.

## 2015-07-23 IMAGING — US US PELVIS COMPLETE
1 series · 14 of 25 positions shown · non-contrast
Comparison: None.

CLINICAL DATA: Right pelvic pain

EXAM:
Pelvic ultrasound: Transabdominal and transvaginal
TECHNIQUE: Study was performed transabdominally to optimize pelvic field of
view evaluation transvaginally to optimize internal visceral
architecture evaluation.

[Series 1: us pelvis complete · 0.24mm/px · 14 of 56 slices shown]
[im 1/56]
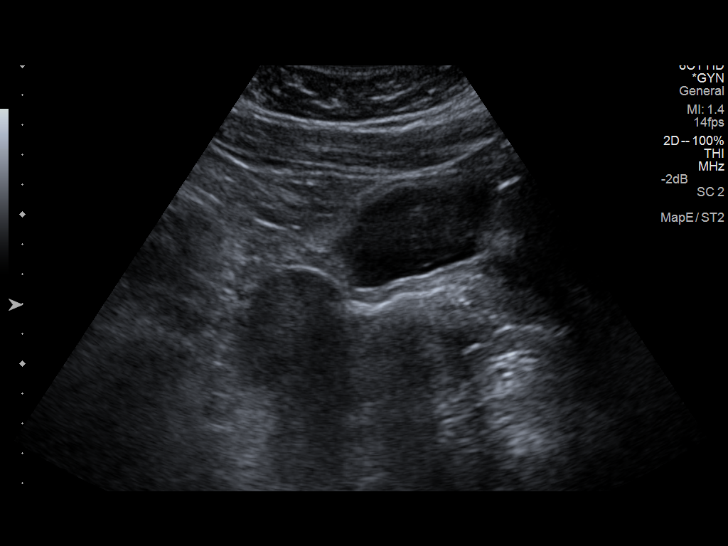
[im 5/56]
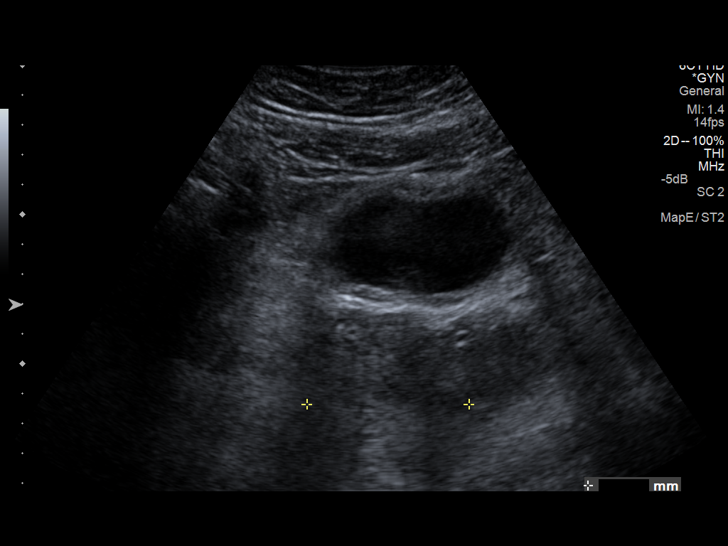
[im 10/56]
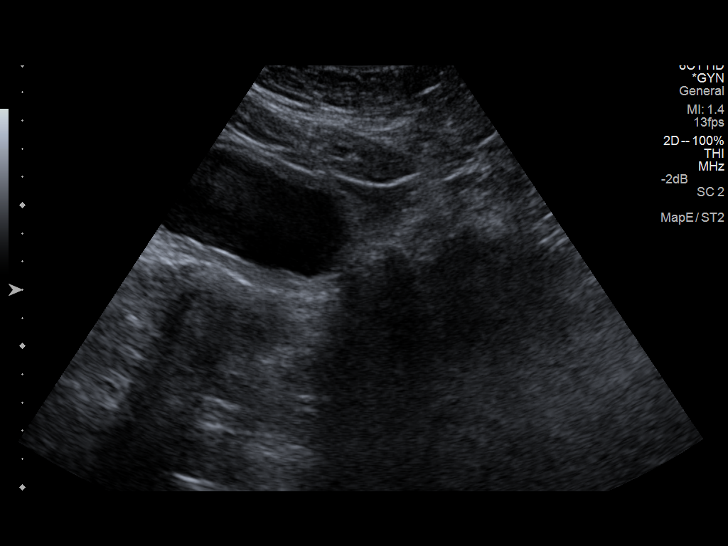
[im 14/56]
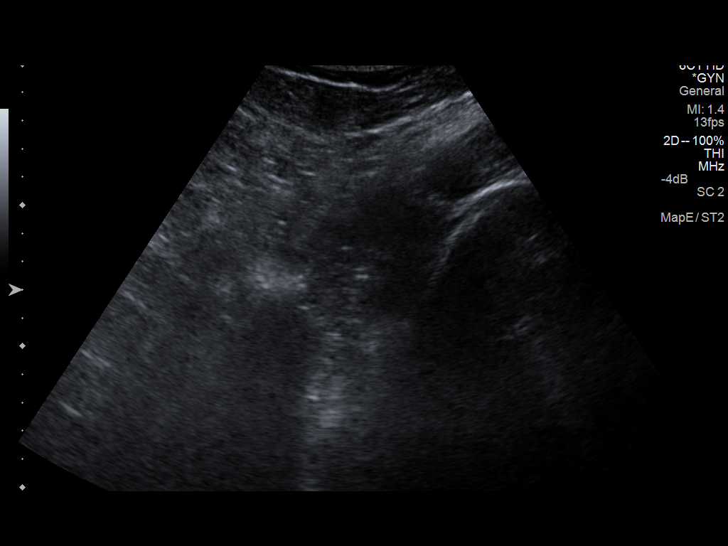
[im 19/56]
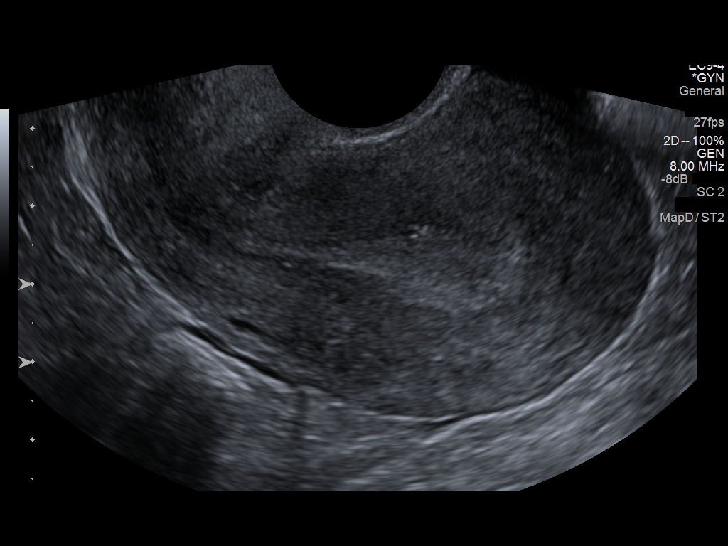
[im 21/56]
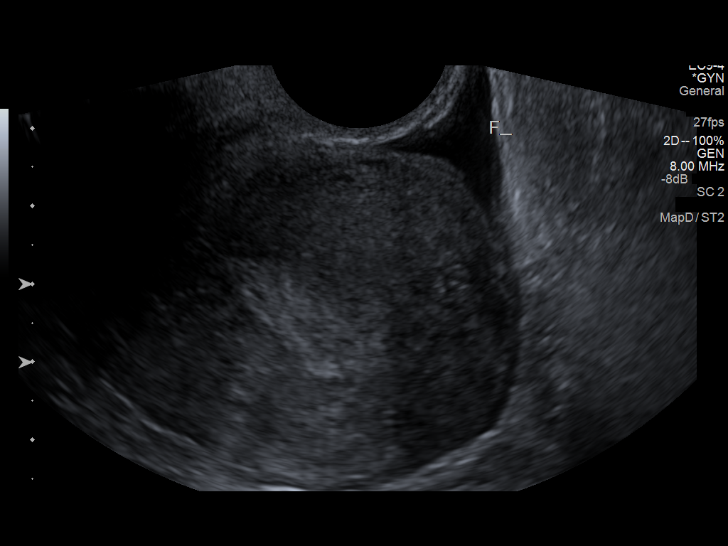
[im 26/56]
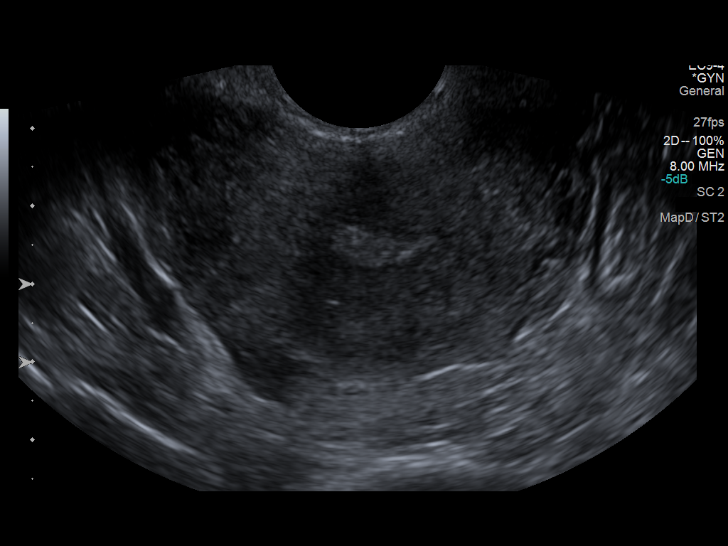
[im 30/56]
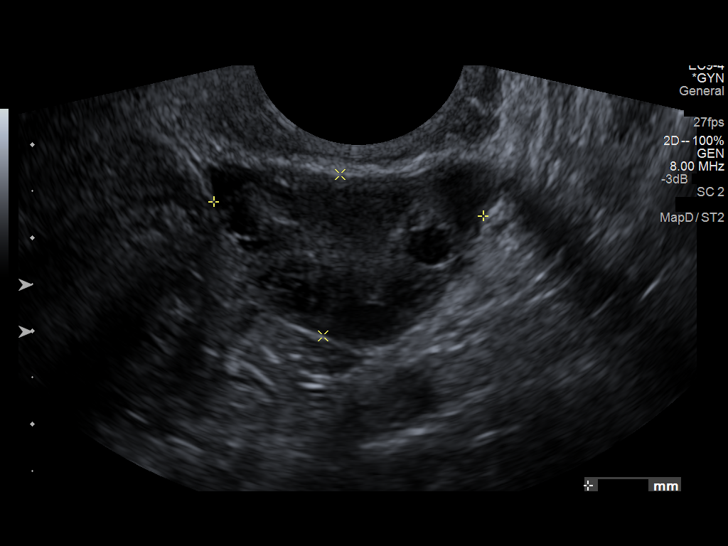
[im 35/56]
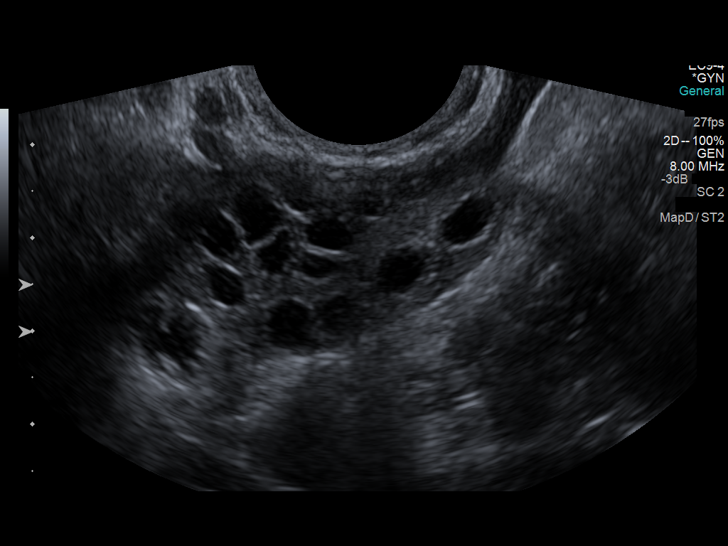
[im 37/56]
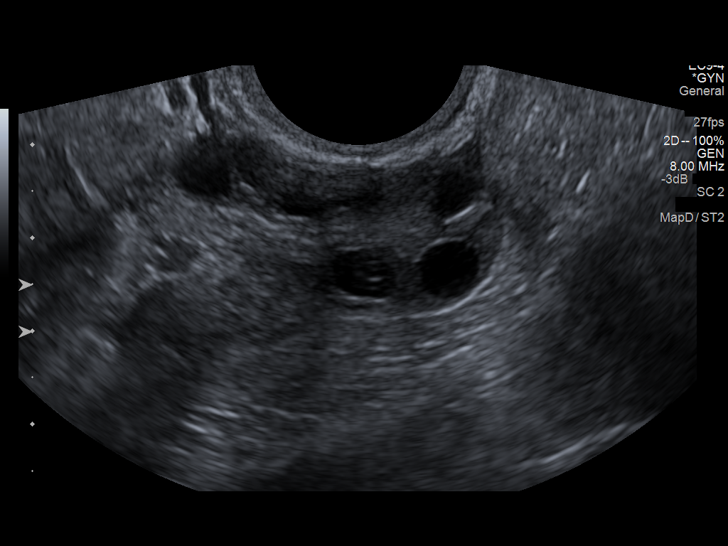
[im 42/56]
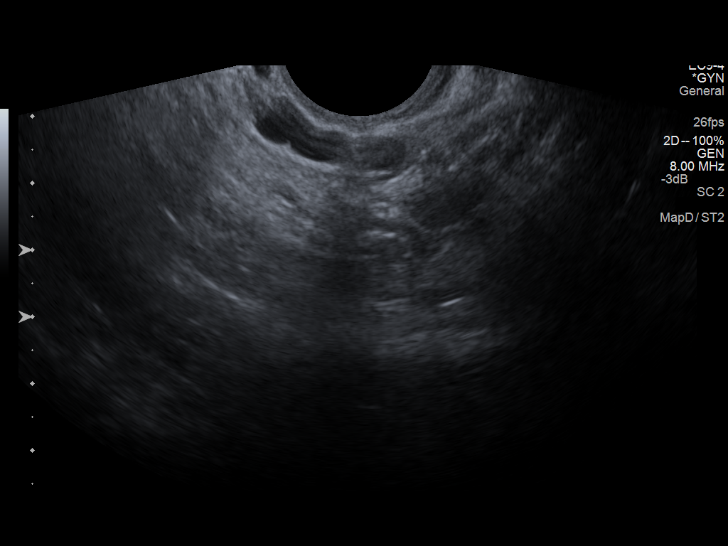
[im 46/56]
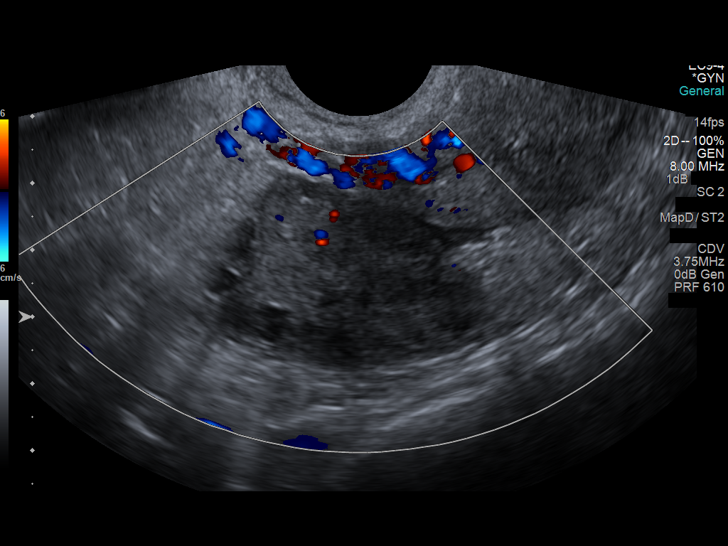
[im 51/56]
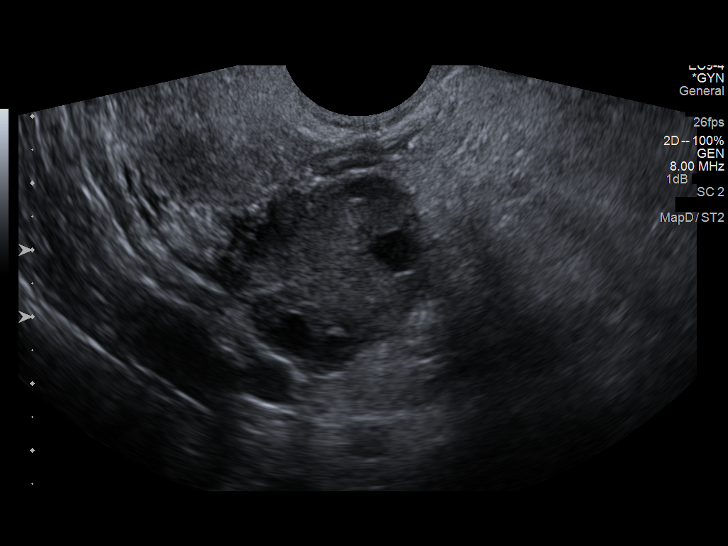
[im 56/56]
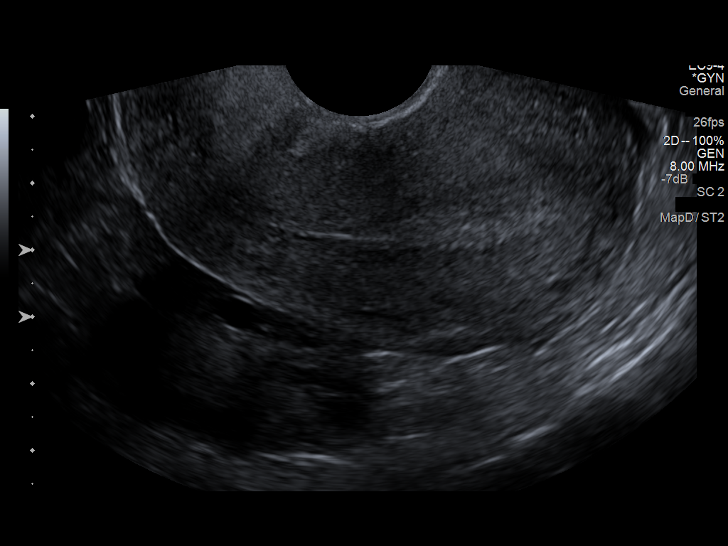

[14 of 25 positions shown; findings below may reference images not displayed]

FINDINGS: Uterus is retroverted. Uterus measures 10.6 x 4.7 x 5.7 cm. There is
no intrauterine mass. Endometrium measures 11 mm in thickness with a
smooth contour.

Right ovary measures 4.5 x 3.6 x 3.0 cm in size. Left ovary measures
2.9 x 1.7 x 4.0 cm in size. There are physiologic follicles in both
ovaries. Beyond physiologic follicles, no extrauterine pelvic masses
are identified.

There is moderate free fluid in the cul-de-sac.
IMPRESSION: Moderate free fluid in the cul-de-sac. Suspect recent ovarian cyst
rupture.

Uterus is retroverted but otherwise appears normal.

## 2016-11-05 ENCOUNTER — Encounter (HOSPITAL_COMMUNITY): Payer: Self-pay | Admitting: Emergency Medicine

## 2016-11-05 ENCOUNTER — Emergency Department (HOSPITAL_COMMUNITY): Payer: Self-pay

## 2016-11-05 DIAGNOSIS — R0602 Shortness of breath: Secondary | ICD-10-CM | POA: Insufficient documentation

## 2016-11-05 DIAGNOSIS — Z5321 Procedure and treatment not carried out due to patient leaving prior to being seen by health care provider: Secondary | ICD-10-CM | POA: Insufficient documentation

## 2016-11-05 LAB — CBC WITH DIFFERENTIAL/PLATELET
BASOS ABS: 0 10*3/uL (ref 0.0–0.1)
BASOS PCT: 0 %
EOS ABS: 0.3 10*3/uL (ref 0.0–0.7)
Eosinophils Relative: 3 %
HCT: 43.5 % (ref 36.0–46.0)
Hemoglobin: 14.6 g/dL (ref 12.0–15.0)
Lymphocytes Relative: 36 %
Lymphs Abs: 4.2 10*3/uL — ABNORMAL HIGH (ref 0.7–4.0)
MCH: 29.6 pg (ref 26.0–34.0)
MCHC: 33.6 g/dL (ref 30.0–36.0)
MCV: 88.2 fL (ref 78.0–100.0)
MONO ABS: 0.7 10*3/uL (ref 0.1–1.0)
Monocytes Relative: 6 %
Neutro Abs: 6.4 10*3/uL (ref 1.7–7.7)
Neutrophils Relative %: 55 %
Platelets: 255 10*3/uL (ref 150–400)
RBC: 4.93 MIL/uL (ref 3.87–5.11)
RDW: 13.1 % (ref 11.5–15.5)
WBC: 11.7 10*3/uL — AB (ref 4.0–10.5)

## 2016-11-05 LAB — BASIC METABOLIC PANEL
Anion gap: 7 (ref 5–15)
BUN: 7 mg/dL (ref 6–20)
CHLORIDE: 110 mmol/L (ref 101–111)
CO2: 20 mmol/L — ABNORMAL LOW (ref 22–32)
Calcium: 9.1 mg/dL (ref 8.9–10.3)
Creatinine, Ser: 0.88 mg/dL (ref 0.44–1.00)
GFR calc Af Amer: >60 mL/min (ref >60)
GLUCOSE: 105 mg/dL — AB (ref 65–99)
POTASSIUM: 3.4 mmol/L — AB (ref 3.5–5.1)
Sodium: 137 mmol/L (ref 135–145)

## 2016-11-05 LAB — SALICYLATE LEVEL

## 2016-11-05 LAB — ETHANOL

## 2016-11-05 LAB — I-STAT BETA HCG BLOOD, ED (MC, WL, AP ONLY): I-stat hCG, quantitative: 13.8 m[IU]/mL — ABNORMAL HIGH (ref <5)

## 2016-11-05 LAB — ACETAMINOPHEN LEVEL

## 2016-11-05 NOTE — ED Triage Notes (Signed)
Patient reports intermittent SOB for 2 weeks with productive cough / chest congestion , denies fever or chills .

## 2016-11-06 ENCOUNTER — Emergency Department (HOSPITAL_COMMUNITY)
Admission: EM | Admit: 2016-11-06 | Discharge: 2016-11-06 | Disposition: A | Discharge status: Home / Self Care | Payer: Self-pay | Attending: Emergency Medicine | Admitting: Emergency Medicine

## 2016-11-06 HISTORY — DX: Unspecified asthma, uncomplicated: J45.909

## 2016-11-06 NOTE — ED Notes (Signed)
The pt has been here several times asking for wait time  She has to leave now because her husband has to be at work  Upset  She reports that she is still having difficulty left

## 2018-07-21 NOTE — Patient Instructions (Addendum)
[facility]  07/21/2018      Your procedure is scheduled on  07-30-2018   Report to [facility]  at  5:30A.M.   Call this number if you have problems the morning of surgery:[phone]    OUR ADDRESS IS [address], WE ARE LOCATED [facility] WITH [facility].   Remember:  NO SOLID FOOD AFTER MIDNIGHT THE NIGHT PRIOR TO SURGERY. NOTHING BY MOUTH EXCEPT CLEAR LIQUIDS UNTIL 3 HOURS PRIOR TO SCHEULED SURGERY. PLEASE FINISH ENSURE DRINK PER SURGEON ORDER 3 HOURS PRIOR TO SCHEDULED SURGERY TIME WHICH NEEDS TO BE COMPLETED AT ____4:30AM________.     Take these medicines the morning of surgery with A SIP OF WATER: ZYRTEC, NORETHINDRONE      CLEAR LIQUID DIET   Foods Allowed                                                                     Foods Excluded  Coffee and tea, regular and decaf                             liquids that you cannot  Plain Jell-O in any flavor                                             see through such as: Fruit ices (not with fruit pulp)                                     milk, soups, orange juice  Iced Popsicles                                    All solid food Carbonated beverages, regular and diet                                    Cranberry, grape and apple juices Sports drinks like Gatorade Lightly seasoned clear broth or consume(fat free) Sugar, honey syrup  Sample Menu Breakfast                                Lunch                                     Supper Cranberry juice                    Beef broth                            Chicken broth Jell-O  Grape juice                           Apple juice Coffee or tea                        Jell-O                                      Popsicle                                                Coffee or tea                        Coffee or  tea  _____________________________________________________________________     Do not wear jewelry, make-up or nail polish.  Do not wear lotions, powders, or perfumes, or deoderant.  Do not shave 48 hours prior to surgery.  Men may shave face and neck.  Do not bring valuables to the [facility].  [facility] any belongings or valuables.  Contacts, dentures or bridgework may not be worn into surgery.  Leave your suitcase in the car.  After surgery it may be brought to your room.  For patients admitted to the [facility], discharge time will be determined by your treatment team.  Patients discharged the day of surgery will not be allowed to drive home.   Special instructions: N/A  Please read over the following fact sheets that you were given:       [facility] - Preparing for [facility] Before surgery, you can play an important role.  Because skin is not sterile, your skin needs to be as free of germs as possible.  You can reduce the number of germs on your skin by washing with CHG (chlorahexidine gluconate) soap before surgery.  CHG is an antiseptic cleaner which kills germs and bonds with the skin to continue killing germs even after washing. Please DO NOT use if you have an allergy to CHG or antibacterial soaps.  If your skin becomes reddened/irritated stop using the CHG and inform your nurse when you arrive at Short Stay. Do not shave (including legs and underarms) for at least 48 hours prior to the first CHG shower.  You may shave your face/neck. Please follow these instructions carefully:  1.  Shower with CHG Soap the night before surgery and the  morning of Surgery.  2.  If you choose to wash your hair, wash your hair first as usual with your  normal  shampoo.  3.  After you shampoo, rinse your hair and body thoroughly to remove the  shampoo.                           4.  Use CHG as you would any other liquid soap.  You can apply chg directly  to the skin and wash                        Gently with a scrungie or clean washcloth.  5.  Apply the CHG Soap to your body ONLY FROM THE NECK DOWN.   Do  not use on face/ open                           Wound or open sores. Avoid contact with eyes, ears mouth and genitals (private parts).                       Wash face,  Genitals (private parts) with your normal soap.             6.  Wash thoroughly, paying special attention to the area where your surgery  will be performed.  7.  Thoroughly rinse your body with warm water from the neck down.  8.  DO NOT shower/wash with your normal soap after using and rinsing off  the CHG Soap.                9.  Pat yourself dry with a clean towel.            10.  Wear clean pajamas.            11.  Place clean sheets on your bed the night of your first shower and do not  sleep with pets. Day of Surgery : Do not apply any lotions/deodorants the morning of surgery.  Please wear clean clothes to the [facility]surgery center.  FAILURE TO FOLLOW THESE INSTRUCTIONS MAY RESULT IN THE CANCELLATION OF YOUR SURGERY PATIENT SIGNATURE_________________________________  NURSE SIGNATURE__________________________________  ________________________________________________________________________   [facility]  An incentive spirometer is a tool that can help keep your lungs clear and active. This tool measures how well you are filling your lungs with each breath. Taking long deep breaths may help reverse or decrease the chance of developing breathing (pulmonary) problems (especially infection) following:  A long period of time when you are unable to move or be active. BEFORE THE PROCEDURE   If the spirometer includes an indicator to show your best effort, your nurse or respiratory therapist will set it to a desired goal.  If possible, sit up straight or lean slightly forward. Try not to slouch.  Hold the incentive spirometer in an upright position. INSTRUCTIONS FOR USE  1. Sit  on the edge of your bed if possible, or sit up as far as you can in bed or on a chair. 2. Hold the incentive spirometer in an upright position. 3. Breathe out normally. 4. Place the mouthpiece in your mouth and seal your lips tightly around it. 5. Breathe in slowly and as deeply as possible, raising the piston or the ball toward the top of the column. 6. Hold your breath for 3-5 seconds or for as long as possible. Allow the piston or ball to fall to the bottom of the column. 7. Remove the mouthpiece from your mouth and breathe out normally. 8. Rest for a few seconds and repeat Steps 1 through 7 at least 10 times every 1-2 hours when you are awake. Take your time and take a few normal breaths between deep breaths. 9. The spirometer may include an indicator to show your best effort. Use the indicator as a goal to work toward during each repetition. 10. After each set of 10 deep breaths, practice coughing to be sure your lungs are clear. If you have an incision (the cut made at the time of surgery), support your incision when coughing by placing a pillow or rolled up towels firmly against it. Once you are able to get out of bed,  walk around indoors and cough well. You may stop using the incentive spirometer when instructed by your caregiver.  RISKS AND COMPLICATIONS  Take your time so you do not get dizzy or light-headed.  If you are in pain, you may need to take or ask for pain medication before doing incentive spirometry. It is harder to take a deep breath if you are having pain. AFTER USE  Rest and breathe slowly and easily.  It can be helpful to keep track of a log of your progress. Your caregiver can provide you with a simple table to help with this. If you are using the spirometer at home, follow these instructions: SEEK MEDICAL CARE IF:   You are having difficultly using the spirometer.  You have trouble using the spirometer as often as instructed.  Your pain medication is not giving  enough relief while using the spirometer.  You develop fever of 100.5 F (38.1 C) or higher. SEEK IMMEDIATE MEDICAL CARE IF:   You cough up bloody sputum that had not been present before.  You develop fever of 102 F (38.9 C) or greater.  You develop worsening pain at or near the incision site. MAKE SURE YOU:   Understand these instructions.  Will watch your condition.  Will get help right away if you are not doing well or get worse. Document Released: 10/14/2006 Document Revised: 08/26/2011 Document Reviewed: 12/15/2006 [facility] Patient Information 2014 [place], [place].   ________________________________________________________________________

## 2018-07-23 ENCOUNTER — Encounter (HOSPITAL_COMMUNITY): Payer: Self-pay | Admitting: Emergency Medicine

## 2018-07-23 ENCOUNTER — Encounter (HOSPITAL_COMMUNITY)
Admission: RE | Admit: 2018-07-23 | Discharge: 2018-07-23 | Disposition: A | Discharge status: Home / Self Care | Payer: BLUE CROSS/BLUE SHIELD | Source: Ambulatory Visit | Attending: Obstetrics and Gynecology | Admitting: Obstetrics and Gynecology

## 2018-07-23 ENCOUNTER — Other Ambulatory Visit: Payer: Self-pay

## 2018-07-23 DIAGNOSIS — Z01818 Encounter for other preprocedural examination: Secondary | ICD-10-CM | POA: Diagnosis not present

## 2018-07-23 DIAGNOSIS — R102 Pelvic and perineal pain: Secondary | ICD-10-CM | POA: Diagnosis not present

## 2018-07-23 DIAGNOSIS — N939 Abnormal uterine and vaginal bleeding, unspecified: Secondary | ICD-10-CM | POA: Diagnosis not present

## 2018-07-23 DIAGNOSIS — Z8679 Personal history of other diseases of the circulatory system: Secondary | ICD-10-CM | POA: Insufficient documentation

## 2018-07-23 HISTORY — DX: Abnormal uterine and vaginal bleeding, unspecified: N93.9

## 2018-07-23 HISTORY — DX: Anemia, unspecified: D64.9

## 2018-07-23 HISTORY — DX: Adverse effect of unspecified anesthetic, initial encounter: T41.45XA

## 2018-07-23 HISTORY — DX: Other specified congenital malformations of heart: Q24.8

## 2018-07-23 HISTORY — DX: Unspecified right bundle-branch block: I45.10

## 2018-07-23 HISTORY — DX: Major depressive disorder, single episode, unspecified: F32.9

## 2018-07-23 HISTORY — DX: Dyspnea, unspecified: R06.00

## 2018-07-23 LAB — BASIC METABOLIC PANEL
Anion gap: 6 (ref 5–15)
BUN: 10 mg/dL (ref 6–20)
CO2: 23 mmol/L (ref 22–32)
Calcium: 9 mg/dL (ref 8.9–10.3)
Chloride: 108 mmol/L (ref 98–111)
Creatinine, Ser: 0.66 mg/dL (ref 0.44–1.00)
GFR calc Af Amer: >60 mL/min (ref >60)
GFR calc non Af Amer: >60 mL/min (ref >60)
Glucose, Bld: 104 mg/dL — ABNORMAL HIGH (ref 70–99)
Potassium: 3.8 mmol/L (ref 3.5–5.1)
Sodium: 137 mmol/L (ref 135–145)

## 2018-07-23 LAB — CBC
HEMATOCRIT: 43.2 % (ref 36.0–46.0)
Hemoglobin: 14 g/dL (ref 12.0–15.0)
MCH: 29.4 pg (ref 26.0–34.0)
MCHC: 32.4 g/dL (ref 30.0–36.0)
MCV: 90.8 fL (ref 80.0–100.0)
NRBC: 0 % (ref 0.0–0.2)
Platelets: 266 10*3/uL (ref 150–400)
RBC: 4.76 MIL/uL (ref 3.87–5.11)
RDW: 12.8 % (ref 11.5–15.5)
WBC: 9.1 10*3/uL (ref 4.0–10.5)

## 2018-07-29 NOTE — H&P (Signed)
[facility] is an 33 y.o. female.She was seen in September as a new patient.  Has regular menses monthly, has significant pain, Ibuprofen helps some. Sexually active, some deep pain, has BTL.  Has 2-3 episodes per week of pelvic/low abdominal pain, lasts for 30 minutes or so, random.  She was put on POP, had normal pelvic ultrasound. On follow-up in January, she is having 2 menses/month and spotting with POP, still having pain with menses and random pelvic pain. She feels no improvement with POP, wants to proceed with definitive surgical therapy.  Pertinent Gynecological History: Last pap: normal Date: 02/2018 OB History: G4, P1213 SVD x 3   Menstrual History: No LMP recorded.    Past Medical History:  Diagnosis Date  . Abnormal cardiac valve    PER PATIENT SVT CAUSED BY " AN EXTRA VALVE IN THERE"   . Abnormal uterine bleeding (AUB)   . Anemia   . Asthma    SEASONAL   . Complication of anesthesia    REPORTS " I'VE WOKEN UP DURING BOTH OF MY SURGERIES"  . Depression    IN TEENS   . Dyspnea    WITH EXERTION   . Incomplete right bundle branch block    ON 2018 EKG  . SVT (supraventricular tachycardia) (HCC)    6 MONTHS AGO , WAS ABLE TO CARDIOVERT HERSELF WITH VALSALVA MANUEVER     Past Surgical History:  Procedure Laterality Date  . TUBAL LIGATION    . WISDOM TOOTH EXTRACTION      No family history on file.  Social History:  reports that she has been smoking cigarettes. She has a 4.00 pack-year smoking history. She has never used smokeless tobacco. She reports that she does not drink alcohol or use drugs.  Allergies:  Allergies  Allergen Reactions  . Amoxicillin Anaphylaxis and Hives  . Penicillins Anaphylaxis and Hives    Did it involve swelling of the face/tongue/throat, SOB, or low BP? Yes Did it involve sudden or severe rash/hives, skin peeling, or any reaction on the inside of your mouth or nose? Yes Did you need to seek medical attention at a hospital or  doctor's office? Yes When did it last happen?Childhood allergy If all above answers are "NO", may proceed with cephalosporin use.   Marland Kitchen Ultram [Tramadol] Nausea And Vomiting    Passed out    No medications prior to admission.    Review of Systems  Respiratory: Negative.   Cardiovascular: Negative.     There were no vitals taken for this visit. Physical Exam  Constitutional: She appears well-developed and well-nourished.  Neck: Neck supple. No thyromegaly present.  Cardiovascular: Normal rate, regular rhythm and normal heart sounds.  No murmur heard. Respiratory: Effort normal and breath sounds normal. No respiratory distress. She has no wheezes.  GI: Soft. She exhibits no distension and no mass. There is abdominal tenderness (mild, RLQ).  Genitourinary:    Vagina and uterus normal.     Genitourinary Comments: No adnexal mass, but some right adnexal tenderness     No results found for this or any previous visit (from the past 24 hour(s)).  No results found.  Assessment/Plan: Dysmenorrhea, pelvic pain and AUB.  Normal pelvic ultrasound, no improvement with POP.  All medical and surgical options have been discussed, she wants definitive surgical therapy.  Discussed surgical procedure, risks, chances of relieving her symptoms.  Will admit for LAVH, possible bilateral salpingectomy, possible cystoscopy.  She is on Bactrim for pre-op UTI.  Leighton Roach Tri Chittick 07/29/2018, 7:49 PM

## 2018-07-30 ENCOUNTER — Other Ambulatory Visit: Payer: Self-pay

## 2018-07-30 ENCOUNTER — Ambulatory Visit (HOSPITAL_BASED_OUTPATIENT_CLINIC_OR_DEPARTMENT_OTHER): Payer: BLUE CROSS/BLUE SHIELD | Admitting: Physician Assistant

## 2018-07-30 ENCOUNTER — Encounter (HOSPITAL_BASED_OUTPATIENT_CLINIC_OR_DEPARTMENT_OTHER): Payer: Self-pay | Admitting: Emergency Medicine

## 2018-07-30 ENCOUNTER — Ambulatory Visit (HOSPITAL_BASED_OUTPATIENT_CLINIC_OR_DEPARTMENT_OTHER)
Admission: RE | Admit: 2018-07-30 | Discharge: 2018-07-30 | Disposition: A | Discharge status: Home / Self Care | Payer: BLUE CROSS/BLUE SHIELD | Source: Ambulatory Visit | Attending: Obstetrics and Gynecology | Admitting: Obstetrics and Gynecology

## 2018-07-30 ENCOUNTER — Encounter (HOSPITAL_BASED_OUTPATIENT_CLINIC_OR_DEPARTMENT_OTHER)
Admission: RE | Disposition: A | Discharge status: Home / Self Care | Payer: Self-pay | Source: Ambulatory Visit | Attending: Obstetrics and Gynecology

## 2018-07-30 ENCOUNTER — Ambulatory Visit (HOSPITAL_BASED_OUTPATIENT_CLINIC_OR_DEPARTMENT_OTHER): Payer: BLUE CROSS/BLUE SHIELD | Admitting: Anesthesiology

## 2018-07-30 DIAGNOSIS — N838 Other noninflammatory disorders of ovary, fallopian tube and broad ligament: Secondary | ICD-10-CM | POA: Insufficient documentation

## 2018-07-30 DIAGNOSIS — N7011 Chronic salpingitis: Secondary | ICD-10-CM | POA: Diagnosis not present

## 2018-07-30 DIAGNOSIS — R102 Pelvic and perineal pain: Secondary | ICD-10-CM | POA: Insufficient documentation

## 2018-07-30 DIAGNOSIS — F17210 Nicotine dependence, cigarettes, uncomplicated: Secondary | ICD-10-CM | POA: Insufficient documentation

## 2018-07-30 DIAGNOSIS — N939 Abnormal uterine and vaginal bleeding, unspecified: Secondary | ICD-10-CM | POA: Diagnosis not present

## 2018-07-30 DIAGNOSIS — K660 Peritoneal adhesions (postprocedural) (postinfection): Secondary | ICD-10-CM | POA: Insufficient documentation

## 2018-07-30 DIAGNOSIS — N879 Dysplasia of cervix uteri, unspecified: Secondary | ICD-10-CM | POA: Diagnosis not present

## 2018-07-30 DIAGNOSIS — N946 Dysmenorrhea, unspecified: Secondary | ICD-10-CM | POA: Diagnosis present

## 2018-07-30 DIAGNOSIS — Z90710 Acquired absence of both cervix and uterus: Secondary | ICD-10-CM | POA: Diagnosis present

## 2018-07-30 HISTORY — PX: CYSTOSCOPY: SHX5120

## 2018-07-30 HISTORY — PX: LAPAROSCOPIC VAGINAL HYSTERECTOMY WITH SALPINGECTOMY: SHX6680

## 2018-07-30 LAB — POCT PREGNANCY, URINE: PREG TEST UR: NEGATIVE

## 2018-07-30 SURGERY — HYSTERECTOMY, VAGINAL, LAPAROSCOPY-ASSISTED, WITH SALPINGECTOMY
Anesthesia: General

## 2018-07-30 MED ORDER — PROPOFOL 10 MG/ML IV BOLUS
INTRAVENOUS | Status: DC | PRN
  Administered 2018-07-30: 200 mg via INTRAVENOUS

## 2018-07-30 MED ORDER — GENTAMICIN SULFATE 40 MG/ML IJ SOLN
INTRAVENOUS | Status: AC
  Administered 2018-07-30: 390 mg via INTRAVENOUS
  Filled 2018-07-30: qty 9.75

## 2018-07-30 MED ORDER — SIMETHICONE 80 MG PO CHEW
80.0000 mg | CHEWABLE_TABLET | Freq: Four times a day (QID) | ORAL | Status: DC | PRN
  Filled 2018-07-30: qty 1

## 2018-07-30 MED ORDER — PROMETHAZINE HCL 25 MG/ML IJ SOLN
6.2500 mg | INTRAMUSCULAR | Status: DC | PRN
  Filled 2018-07-30: qty 1

## 2018-07-30 MED ORDER — GABAPENTIN 300 MG PO CAPS
300.0000 mg | ORAL_CAPSULE | Freq: Three times a day (TID) | ORAL | Status: DC
  Administered 2018-07-30: 300 mg via ORAL
  Filled 2018-07-30: qty 1

## 2018-07-30 MED ORDER — KETOROLAC TROMETHAMINE 30 MG/ML IJ SOLN
INTRAMUSCULAR | Status: AC
  Filled 2018-07-30: qty 1

## 2018-07-30 MED ORDER — DEXAMETHASONE SODIUM PHOSPHATE 10 MG/ML IJ SOLN
INTRAMUSCULAR | Status: DC | PRN
  Administered 2018-07-30: 10 mg via INTRAVENOUS

## 2018-07-30 MED ORDER — SUGAMMADEX SODIUM 200 MG/2ML IV SOLN
INTRAVENOUS | Status: DC | PRN
  Administered 2018-07-30: 200 mg via INTRAVENOUS

## 2018-07-30 MED ORDER — SUGAMMADEX SODIUM 200 MG/2ML IV SOLN
INTRAVENOUS | Status: AC
  Filled 2018-07-30: qty 2

## 2018-07-30 MED ORDER — ACETAMINOPHEN 500 MG PO TABS
1000.0000 mg | ORAL_TABLET | ORAL | Status: AC
  Administered 2018-07-30: 1000 mg via ORAL
  Filled 2018-07-30: qty 2

## 2018-07-30 MED ORDER — CELECOXIB 400 MG PO CAPS
400.0000 mg | ORAL_CAPSULE | ORAL | Status: AC
  Administered 2018-07-30: 400 mg via ORAL
  Filled 2018-07-30: qty 1

## 2018-07-30 MED ORDER — GABAPENTIN 300 MG PO CAPS
300.0000 mg | ORAL_CAPSULE | ORAL | Status: AC
  Administered 2018-07-30: 300 mg via ORAL
  Filled 2018-07-30: qty 1

## 2018-07-30 MED ORDER — SCOPOLAMINE 1 MG/3DAYS TD PT72
MEDICATED_PATCH | TRANSDERMAL | Status: AC
  Filled 2018-07-30: qty 1

## 2018-07-30 MED ORDER — DEXAMETHASONE SODIUM PHOSPHATE 10 MG/ML IJ SOLN
INTRAMUSCULAR | Status: AC
  Filled 2018-07-30: qty 1

## 2018-07-30 MED ORDER — BISACODYL 10 MG RE SUPP
10.0000 mg | Freq: Every day | RECTAL | Status: DC | PRN
  Filled 2018-07-30: qty 1

## 2018-07-30 MED ORDER — MENTHOL 3 MG MT LOZG
1.0000 | LOZENGE | OROMUCOSAL | Status: DC | PRN
  Filled 2018-07-30: qty 9

## 2018-07-30 MED ORDER — GABAPENTIN 300 MG PO CAPS: 300.0000 mg | ORAL_CAPSULE | Freq: Three times a day (TID) | ORAL | 0 refills | Status: DC

## 2018-07-30 MED ORDER — ACETAMINOPHEN 160 MG/5ML PO SOLN
325.0000 mg | Freq: Once | ORAL | Status: DC
  Filled 2018-07-30: qty 20.3

## 2018-07-30 MED ORDER — ONDANSETRON HCL 4 MG/2ML IJ SOLN
INTRAMUSCULAR | Status: AC
  Filled 2018-07-30: qty 2

## 2018-07-30 MED ORDER — ACETAMINOPHEN 500 MG PO TABS
ORAL_TABLET | ORAL | Status: AC
  Filled 2018-07-30: qty 2

## 2018-07-30 MED ORDER — SODIUM CHLORIDE 0.9 % IR SOLN
Status: DC | PRN
  Administered 2018-07-30: 3000 mL

## 2018-07-30 MED ORDER — MIDAZOLAM HCL 2 MG/2ML IJ SOLN
INTRAMUSCULAR | Status: AC
  Filled 2018-07-30: qty 2

## 2018-07-30 MED ORDER — FENTANYL CITRATE (PF) 250 MCG/5ML IJ SOLN
INTRAMUSCULAR | Status: AC
  Filled 2018-07-30: qty 5

## 2018-07-30 MED ORDER — CELECOXIB 200 MG PO CAPS
ORAL_CAPSULE | ORAL | Status: AC
  Filled 2018-07-30: qty 2

## 2018-07-30 MED ORDER — BUPIVACAINE HCL (PF) 0.5 % IJ SOLN
INTRAMUSCULAR | Status: DC | PRN
  Administered 2018-07-30: 5 mL

## 2018-07-30 MED ORDER — MIDAZOLAM HCL 2 MG/2ML IJ SOLN
INTRAMUSCULAR | Status: DC | PRN
  Administered 2018-07-30: 2 mg via INTRAVENOUS

## 2018-07-30 MED ORDER — LIDOCAINE 2% (20 MG/ML) 5 ML SYRINGE
INTRAMUSCULAR | Status: DC | PRN
  Administered 2018-07-30: 1.5 mg/kg/h via INTRAVENOUS

## 2018-07-30 MED ORDER — OXYCODONE HCL 5 MG PO TABS
5.0000 mg | ORAL_TABLET | ORAL | Status: DC | PRN
  Administered 2018-07-30: 10 mg via ORAL
  Filled 2018-07-30: qty 2

## 2018-07-30 MED ORDER — KETAMINE HCL 10 MG/ML IJ SOLN
INTRAMUSCULAR | Status: DC | PRN
  Administered 2018-07-30: 40 mg via INTRAVENOUS

## 2018-07-30 MED ORDER — KETOROLAC TROMETHAMINE 30 MG/ML IJ SOLN
30.0000 mg | Freq: Once | INTRAMUSCULAR | Status: DC
  Filled 2018-07-30: qty 1

## 2018-07-30 MED ORDER — HYDROMORPHONE HCL 1 MG/ML IJ SOLN
0.2500 mg | INTRAMUSCULAR | Status: DC | PRN
  Administered 2018-07-30: 0.5 mg via INTRAVENOUS
  Filled 2018-07-30: qty 0.5

## 2018-07-30 MED ORDER — OXYCODONE HCL 5 MG PO TABS: 5.0000 mg | ORAL_TABLET | ORAL | 0 refills | Status: DC | PRN

## 2018-07-30 MED ORDER — SODIUM CHLORIDE (PF) 0.9 % IJ SOLN: INTRAMUSCULAR | Status: DC | PRN

## 2018-07-30 MED ORDER — LIDOCAINE 2% (20 MG/ML) 5 ML SYRINGE
INTRAMUSCULAR | Status: AC
  Filled 2018-07-30: qty 5

## 2018-07-30 MED ORDER — LIDOCAINE 2% (20 MG/ML) 5 ML SYRINGE
INTRAMUSCULAR | Status: DC | PRN
  Administered 2018-07-30: 50 mg via INTRAVENOUS

## 2018-07-30 MED ORDER — ROCURONIUM BROMIDE 100 MG/10ML IV SOLN
INTRAVENOUS | Status: AC
  Filled 2018-07-30: qty 1

## 2018-07-30 MED ORDER — HYDROMORPHONE HCL 1 MG/ML IJ SOLN
INTRAMUSCULAR | Status: AC
  Filled 2018-07-30: qty 1

## 2018-07-30 MED ORDER — ONDANSETRON HCL 4 MG PO TABS
4.0000 mg | ORAL_TABLET | Freq: Four times a day (QID) | ORAL | Status: DC | PRN
  Filled 2018-07-30: qty 1

## 2018-07-30 MED ORDER — ACETAMINOPHEN 325 MG PO TABS
325.0000 mg | ORAL_TABLET | Freq: Once | ORAL | Status: DC
  Filled 2018-07-30: qty 2

## 2018-07-30 MED ORDER — BUPIVACAINE HCL (PF) 0.25 % IJ SOLN
INTRAMUSCULAR | Status: DC | PRN
  Administered 2018-07-30: 13 mL

## 2018-07-30 MED ORDER — ROCURONIUM BROMIDE 10 MG/ML (PF) SYRINGE
PREFILLED_SYRINGE | INTRAVENOUS | Status: DC | PRN
  Administered 2018-07-30: 50 mg via INTRAVENOUS
  Administered 2018-07-30: 10 mg via INTRAVENOUS

## 2018-07-30 MED ORDER — MEPERIDINE HCL 25 MG/ML IJ SOLN
6.2500 mg | INTRAMUSCULAR | Status: DC | PRN
  Filled 2018-07-30: qty 1

## 2018-07-30 MED ORDER — IBUPROFEN 800 MG PO TABS
800.0000 mg | ORAL_TABLET | Freq: Four times a day (QID) | ORAL | Status: DC
  Filled 2018-07-30: qty 1

## 2018-07-30 MED ORDER — HYDROMORPHONE HCL 1 MG/ML IJ SOLN
1.0000 mg | INTRAMUSCULAR | Status: DC | PRN
  Filled 2018-07-30: qty 1

## 2018-07-30 MED ORDER — LACTATED RINGERS IV SOLN
INTRAVENOUS | Status: DC
  Filled 2018-07-30: qty 1000

## 2018-07-30 MED ORDER — DEXTROSE-NACL 5-0.45 % IV SOLN
INTRAVENOUS | Status: DC
  Administered 2018-07-30: 125 mL/h via INTRAVENOUS
  Filled 2018-07-30: qty 1000

## 2018-07-30 MED ORDER — SCOPOLAMINE 1 MG/3DAYS TD PT72
1.0000 | MEDICATED_PATCH | TRANSDERMAL | Status: DC
  Administered 2018-07-30: 1.5 mg via TRANSDERMAL
  Filled 2018-07-30: qty 1

## 2018-07-30 MED ORDER — KETAMINE HCL 10 MG/ML IJ SOLN
INTRAMUSCULAR | Status: AC
  Filled 2018-07-30: qty 1

## 2018-07-30 MED ORDER — GABAPENTIN 300 MG PO CAPS
ORAL_CAPSULE | ORAL | Status: AC
  Filled 2018-07-30: qty 1

## 2018-07-30 MED ORDER — PROPOFOL 10 MG/ML IV BOLUS
INTRAVENOUS | Status: AC
  Filled 2018-07-30: qty 40

## 2018-07-30 MED ORDER — ONDANSETRON HCL 4 MG/2ML IJ SOLN
4.0000 mg | Freq: Four times a day (QID) | INTRAMUSCULAR | Status: DC | PRN
  Administered 2018-07-30: 4 mg via INTRAVENOUS
  Filled 2018-07-30: qty 2

## 2018-07-30 MED ORDER — ARTIFICIAL TEARS OPHTHALMIC OINT
TOPICAL_OINTMENT | OPHTHALMIC | Status: AC
  Filled 2018-07-30: qty 3.5

## 2018-07-30 MED ORDER — ACETAMINOPHEN 500 MG PO TABS
1000.0000 mg | ORAL_TABLET | Freq: Four times a day (QID) | ORAL | Status: DC
  Administered 2018-07-30: 1000 mg via ORAL
  Filled 2018-07-30: qty 2

## 2018-07-30 MED ORDER — OXYCODONE HCL 5 MG PO TABS
ORAL_TABLET | ORAL | Status: AC
  Filled 2018-07-30: qty 2

## 2018-07-30 MED ORDER — LACTATED RINGERS IV SOLN
INTRAVENOUS | Status: DC
  Administered 2018-07-30 (×2): via INTRAVENOUS
  Filled 2018-07-30: qty 1000

## 2018-07-30 MED ORDER — ONDANSETRON HCL 4 MG/2ML IJ SOLN
INTRAMUSCULAR | Status: DC | PRN
  Administered 2018-07-30: 4 mg via INTRAVENOUS

## 2018-07-30 MED ORDER — FENTANYL CITRATE (PF) 100 MCG/2ML IJ SOLN
INTRAMUSCULAR | Status: DC | PRN
  Administered 2018-07-30 (×2): 50 ug via INTRAVENOUS
  Administered 2018-07-30: 100 ug via INTRAVENOUS

## 2018-07-30 MED ORDER — ACETAMINOPHEN 10 MG/ML IV SOLN
1000.0000 mg | Freq: Once | INTRAVENOUS | Status: DC | PRN
  Filled 2018-07-30: qty 100

## 2018-07-30 MED ORDER — VASOPRESSIN 20 UNIT/ML IV SOLN
INTRAVENOUS | Status: DC | PRN
  Administered 2018-07-30: 20 mL via INTRAMUSCULAR

## 2018-07-30 MED ORDER — ALUM & MAG HYDROXIDE-SIMETH 200-200-20 MG/5ML PO SUSP
30.0000 mL | ORAL | Status: DC | PRN
  Filled 2018-07-30: qty 30

## 2018-07-30 MED ORDER — FLUORESCEIN SODIUM 10 % IV SOLN
INTRAVENOUS | Status: DC | PRN
  Administered 2018-07-30: 250 mg via INTRAVENOUS

## 2018-07-30 MED ORDER — KETOROLAC TROMETHAMINE 30 MG/ML IJ SOLN
30.0000 mg | Freq: Four times a day (QID) | INTRAMUSCULAR | Status: DC
  Administered 2018-07-30: 30 mg via INTRAVENOUS
  Filled 2018-07-30: qty 1

## 2018-07-30 MED ORDER — FLUORESCEIN SODIUM 10 % IV SOLN
INTRAVENOUS | Status: AC
  Filled 2018-07-30: qty 5

## 2018-07-30 SURGICAL SUPPLY — 38 items
CATH ROBINSON RED A/P 16FR (CATHETERS) ×8 IMPLANT
CONT PATH 16OZ SNAP LID 3702 (MISCELLANEOUS) ×4 IMPLANT
COVER BACK TABLE 60X90IN (DRAPES) ×4 IMPLANT
COVER MAYO STAND STRL (DRAPES) ×4 IMPLANT
DECANTER SPIKE VIAL GLASS SM (MISCELLANEOUS) ×16 IMPLANT
DERMABOND ADVANCED (GAUZE/BANDAGES/DRESSINGS) ×2
DERMABOND ADVANCED .7 DNX12 (GAUZE/BANDAGES/DRESSINGS) ×2 IMPLANT
DURAPREP 26ML APPLICATOR (WOUND CARE) ×4 IMPLANT
ELECT REM PT RETURN 9FT ADLT (ELECTROSURGICAL)
ELECTRODE REM PT RTRN 9FT ADLT (ELECTROSURGICAL) IMPLANT
GLOVE BIO SURGEON STRL SZ8 (GLOVE) ×4 IMPLANT
GLOVE BIOGEL PI IND STRL 6.5 (GLOVE) ×2 IMPLANT
GLOVE BIOGEL PI IND STRL 7.0 (GLOVE) ×4 IMPLANT
GLOVE BIOGEL PI INDICATOR 6.5 (GLOVE) ×2
GLOVE BIOGEL PI INDICATOR 7.0 (GLOVE) ×4
GLOVE ORTHO TXT STRL SZ7.5 (GLOVE) ×8 IMPLANT
LEGGING LITHOTOMY PAIR STRL (DRAPES) ×4 IMPLANT
NEEDLE INSUFFLATION 120MM (ENDOMECHANICALS) ×4 IMPLANT
NS IRRIG 1000ML POUR BTL (IV SOLUTION) ×4 IMPLANT
PACK LAVH (CUSTOM PROCEDURE TRAY) ×4 IMPLANT
PACK ROBOTIC GOWN (GOWN DISPOSABLE) ×4 IMPLANT
PACK TRENDGUARD 450 HYBRID PRO (MISCELLANEOUS) IMPLANT
PROTECTOR NERVE ULNAR (MISCELLANEOUS) ×8 IMPLANT
SET CYSTO W/LG BORE CLAMP LF (SET/KITS/TRAYS/PACK) IMPLANT
SET IRRIG TUBING LAPAROSCOPIC (IRRIGATION / IRRIGATOR) ×4 IMPLANT
SHEARS FOC LG CVD HARMONIC 17C (MISCELLANEOUS) ×4 IMPLANT
SHEARS HARMONIC ACE PLUS 36CM (ENDOMECHANICALS) IMPLANT
SLEEVE XCEL OPT CAN 5 100 (ENDOMECHANICALS) ×4 IMPLANT
SUT CHROMIC 1MO 4 18 CR8 (SUTURE) ×4 IMPLANT
SUT CHROMIC GUT AB #0 18 (SUTURE) ×4 IMPLANT
SUT SILK 2 0 SH (SUTURE) ×4 IMPLANT
SUT VIC AB 2-0 CT1 (SUTURE) ×4 IMPLANT
SUT VICRYL 4-0 PS2 18IN ABS (SUTURE) ×4 IMPLANT
TOWEL OR 17X24 6PK STRL BLUE (TOWEL DISPOSABLE) ×8 IMPLANT
TRENDGUARD 450 HYBRID PRO PACK (MISCELLANEOUS)
TROCAR XCEL NON-BLD 5MMX100MML (ENDOMECHANICALS) ×4 IMPLANT
TUBING INSUF HEATED (TUBING) ×4 IMPLANT
WARMER LAPAROSCOPE (MISCELLANEOUS) ×4 IMPLANT

## 2018-07-30 NOTE — Op Note (Signed)
Preoperative diagnosis: Abnormal uterine bleeding, dysmenorrhea, pelvic pain Postoperative diagnosis:  Same  Procedure: Laparoscopic-assisted vaginal hysterectomy, bilateral salpingectomy, cystoscopy Surgeon: Lavina Hammanodd Lenwood Balsam M.D. Assistant: Doristine Counterecelia Banga, D.O. Anesthesia: Gen. Endotracheal tube Findings: She had a normal upper abdomen. She had a fairly normal pelvis. She had omentum adherent to the anterior abdominal wall beneath the umbilicus.. She had evidence of previous tubal ligation and had a 2 cm cyst at her distal right fallopian tube.  There were possibly some small spots in the posterior cul-de-sac that could be endometriosis. Estimated blood loss: 100 cc Specimens: Uterus, bilateral fallopian tubes sent for routine pathology Complications: None  Procedure in detail: The patient was taken to the operating room and placed in the dorsosupine position. General anesthesia was induced and legs were placed in mobile stirrups and arms tucked to her sides. Abdomen and perineum were then prepped and draped in usual sterile fashion, bladder drained with a red Robinson catheter, acorn tenaculum applied to the cervix for uterine manipulation. Infraumbilical skin was then infiltrated with quarter percent Marcaine and a 1 cm vertical incision was made. Veress needle was inserted into the peritoneal cavity and placement confirmed by the water drop test an opening pressure of 6 mm mercury. CO2 was insufflated to a pressure of 13 mm mercury and the Veress needle was removed. A 5 mm trocar was introduced with direct visualization. A 5 mm port was then placed on the left side also under direct visualization. Inspection revealed the above-mentioned findings. A third 5 mm port was placed on the right side also under direct visualization.  The distal right fallopian tube was grasped with a 5 mm tenaculum. The Harmonic Scalpel ACE was then used to take down the right mesosalpinx, utero-ovarian pedicle, right round  ligament, right broad ligament and incised the anterior peritoneum across the anterior lower part of the uterus. A similar procedure was then performed on the left side taking down the mesosalpinx, utero-ovarian pedicle, round ligament, broad ligament.  Anterior peritoneum was incised towards the midline to meet the incision coming from the right side.  Both uterine arteries were also easily identified and taken down with the harmonic scalpel ACE with adequate hemostasis.  At this point the omental adhesion to the anterior abdominal wall was taken down with some difficulty with harmonic scalpel.  All pedicles appeared to be hemostatic and attention was turned vaginally.  The legs were elevated in stirrups. A weighted speculum was inserted in the vagina. The cervix was grasped with Christella HartiganJacobs tenaculum. A dilute solution of Pitressin with Marcaine was infiltrated around the cervicovaginal junction which was then incised circumferentially with electrocautery. Sharp dissection was then used to further free the vagina from the cervix. Anterior peritoneum was identified and entered sharply. A Deaver retractor was then to retract the bladder anterior. Posterior cul-de-sac was identified and entered sharply. A Bonnano speculum was placed into the posterior cul-de-sac. Uterosacral ligaments were clamped transected and ligated with #1 chromic and tagged for later use. The remaining small pedicle was clamped transected and ligated and the uterus was removed. No significant bleeding from any pedicles was identified. The uterosacral ligaments were plicated in the midline with 0 silk and the previously tagged uterosacral pedicles were also tied in the midline. The vaginal cuff was then closed in a vertical fashion with running locking 2-0 Vicryl with adequate closure and adequate hemostasis.  Cystoscopy was then performed with a 70 degrees cystoscope.  She had previously been given floor seen IV.  The bladder appeared normal  and  urine was seen to flow freely from each ureteral orifice.  The cystoscope was removed and bladder drained with a red Robinson catheter which was then removed.  Attention was turned back to the abdomen. Dr. Mindi SlickerBanga and the scrub tech and myself all gowns and changed gloves. The abdomen was reinsufflated. Laparoscope was reinserted and good visualization was achieved.  The pelvis was copiously irrigated, no significant bleeding was identified. Both ureters were identified and found to be below incision lines.  The 5 mm port on the each side was removed under direct visualization. The laparoscope was then removed and all gas was allowed to deflate from the abdomen. The umbilical trocar was then removed. Skin incisions were closed with interrupted subcuticular sutures of 4-0 Vicryl followed by Dermabond. The patient was awakened in the operating room and taken to the recovery room in stable condition after tolerating the procedure well. Counts were correct x2, she received gentamicin and clindamycin IV the beginning of the procedure, she had PAS hose on throughout the procedure.

## 2018-07-30 NOTE — Discharge Instructions (Signed)
Oxycodone 10mg  given at 4:30, next dose due at 10:30pm    Post Anesthesia Home Care Instructions  Activity: Get plenty of rest for the remainder of the day. A responsible individual must stay with you for 24 hours following the procedure.  For the next 24 hours, DO NOT: -Drive a car -Advertising copywriter -Drink alcoholic beverages -Take any medication unless instructed by your physician -Make any legal decisions or sign important papers.  Meals: Start with liquid foods such as gelatin or soup. Progress to regular foods as tolerated. Avoid greasy, spicy, heavy foods. If nausea and/or vomiting occur, drink only clear liquids until the nausea and/or vomiting subsides. Call your physician if vomiting continues.  Special Instructions/Symptoms: Your throat may feel dry or sore from the anesthesia or the breathing tube placed in your throat during surgery. If this causes discomfort, gargle with warm salt water. The discomfort should disappear within 24 hours.  If you had a scopolamine patch placed behind your ear for the management of post- operative nausea and/or vomiting:  1. The medication in the patch is effective for 72 hours, after which it should be removed.  Wrap patch in a tissue and discard in the trash. Wash hands thoroughly with soap and water. 2. You may remove the patch earlier than 72 hours if you experience unpleasant side effects which may include dry mouth, dizziness or visual disturbances. 3. Avoid touching the patch. Wash your hands with soap and water after contact with the patch.    Routine instructions for hysterectomy

## 2018-07-30 NOTE — Interval H&P Note (Signed)
History and Physical Interval Note:  07/30/2018 7:14 AM  [facility]  has presented today for surgery, with the diagnosis of AUB and pain  The various methods of treatment have been discussed with the patient and family. After consideration of risks, benefits and other options for treatment, the patient has consented to  Procedure(s): LAPAROSCOPIC ASSISTED VAGINAL HYSTERECTOMY WITH SALPINGECTOMY (Bilateral) CYSTOSCOPY (N/A) as a surgical intervention .  The patient's history has been reviewed, patient examined, no change in status, stable for surgery.  I have reviewed the patient's chart and labs.  Questions were answered to the patient's satisfaction.     Leighton Roach Brelynn Wheller

## 2018-07-30 NOTE — Anesthesia Preprocedure Evaluation (Addendum)
Anesthesia Evaluation  Patient identified by MRN, date of birth, ID band Patient awake    Reviewed: Allergy & Precautions, NPO status , Patient's Chart, lab work & pertinent test results  Airway Mallampati: II  TM Distance: >3 FB Neck ROM: Full    Dental  (+) Teeth Intact, Dental Advisory Given,    Pulmonary asthma , Current Smoker,    breath sounds clear to auscultation       Cardiovascular + dysrhythmias  Rhythm:Regular Rate:Normal     Neuro/Psych Depression    GI/Hepatic negative GI ROS, Neg liver ROS,   Endo/Other  negative endocrine ROS  Renal/GU negative Renal ROS     Musculoskeletal negative musculoskeletal ROS (+)   Abdominal (+) + obese,   Peds  Hematology negative hematology ROS (+)   Anesthesia Other Findings   Reproductive/Obstetrics                           Lab Results  Component Value Date   WBC 9.1 07/23/2018   HGB 14.0 07/23/2018   HCT 43.2 07/23/2018   MCV 90.8 07/23/2018   PLT 266 07/23/2018   Lab Results  Component Value Date   CREATININE 0.66 07/23/2018   BUN 10 07/23/2018   NA 137 07/23/2018   K 3.8 07/23/2018   CL 108 07/23/2018   CO2 23 07/23/2018   No results found for: INR, PROTIME  EKG: normal sinus rhythm.   Anesthesia Physical Anesthesia Plan  ASA: II  Anesthesia Plan: General   Post-op Pain Management:    Induction: Intravenous  PONV Risk Score and Plan: 3 and Ondansetron, Dexamethasone and Midazolam  Airway Management Planned: Oral ETT  Additional Equipment: None  Intra-op Plan:   Post-operative Plan: Extubation in OR  Informed Consent: I have reviewed the patients History and Physical, chart, labs and discussed the procedure including the risks, benefits and alternatives for the proposed anesthesia with the patient or authorized representative who has indicated his/her understanding and acceptance.     Dental advisory  given  Plan Discussed with: CRNA  Anesthesia Plan Comments:        Anesthesia Quick Evaluation

## 2018-07-30 NOTE — Transfer of Care (Signed)
Immediate Anesthesia Transfer of Care Note  Patient: Stephanie Peterson  Procedure(s) Performed: LAPAROSCOPIC ASSISTED VAGINAL HYSTERECTOMY WITH SALPINGECTOMY (Bilateral ) CYSTOSCOPY (N/A )  Patient Location: PACU  Anesthesia Type:General  Level of Consciousness: sedated and patient cooperative  Airway & Oxygen Therapy: Patient Spontanous Breathing and Patient connected to nasal cannula oxygen  Post-op Assessment: Report given to RN and Post -op Vital signs reviewed and stable  Post vital signs: Reviewed and stable  Last Vitals:  Vitals Value Taken Time  BP    Temp    Pulse 80 07/30/2018  9:42 AM  Resp 17 07/30/2018  9:42 AM  SpO2 95 % 07/30/2018  9:42 AM  Vitals shown include unvalidated device data.  Last Pain:  Vitals:   07/30/18 0539  TempSrc: Oral         Complications: No apparent anesthesia complications

## 2018-07-30 NOTE — Progress Notes (Signed)
Post-op check Doing well, some pain but tolerable, has ambulated, voided and eaten Afeb, VSS Abd- soft, incisions intact D/c home

## 2018-07-30 NOTE — Anesthesia Postprocedure Evaluation (Signed)
Anesthesia Post Note  Patient: Stephanie Peterson  Procedure(s) Performed: LAPAROSCOPIC ASSISTED VAGINAL HYSTERECTOMY WITH SALPINGECTOMY (Bilateral ) CYSTOSCOPY (N/A )     Patient location during evaluation: PACU Anesthesia Type: General Level of consciousness: awake and alert Pain management: pain level controlled Vital Signs Assessment: post-procedure vital signs reviewed and stable Respiratory status: spontaneous breathing, nonlabored ventilation, respiratory function stable and patient connected to nasal cannula oxygen Cardiovascular status: blood pressure returned to baseline and stable Postop Assessment: no apparent nausea or vomiting Anesthetic complications: no    Last Vitals:  Vitals:   07/30/18 1110 07/30/18 1140  BP: 122/61 (!) 119/56  Pulse: 88 71  Resp: 18 16  Temp: 36.8 C 36.6 C  SpO2: 95% 97%    Last Pain:  Vitals:   07/30/18 1140  TempSrc:   PainSc: 4                  Shelton Silvas

## 2018-07-30 NOTE — Anesthesia Procedure Notes (Signed)
Procedure Name: Intubation Date/Time: 07/30/2018 7:37 AM Performed by: Wanita Chamberlain, CRNA Pre-anesthesia Checklist: Patient identified, Emergency Drugs available, Suction available and Patient being monitored Patient Re-evaluated:Patient Re-evaluated prior to induction Oxygen Delivery Method: Circle system utilized Preoxygenation: Pre-oxygenation with 100% oxygen Induction Type: IV induction Ventilation: Mask ventilation without difficulty Laryngoscope Size: Mac and 3 Grade View: Grade II Tube type: Oral Laser Tube: Cuffed inflated with minimal occlusive pressure - saline Tube size: 7.0 mm Number of attempts: 1 Placement Confirmation: ETT inserted through vocal cords under direct vision,  positive ETCO2,  CO2 detector and breath sounds checked- equal and bilateral Secured at: 21 cm Tube secured with: Tape Dental Injury: Teeth and Oropharynx as per pre-operative assessment

## 2018-07-31 ENCOUNTER — Encounter (HOSPITAL_BASED_OUTPATIENT_CLINIC_OR_DEPARTMENT_OTHER): Payer: Self-pay | Admitting: Obstetrics and Gynecology

## 2018-07-31 NOTE — Discharge Summary (Signed)
Physician Discharge Summary  Patient ID: Stephanie Peterson MRN: [id number] DOB/AGE: [birth year] 35 y.o.  Admit date: 07/30/2018 Discharge date: 07/31/2018  Admission Diagnoses:  Abnormal uterine bleeding, dysmenorrhea, pelvic pain  Discharge Diagnoses: Same Active Problems:   S/P hysterectomy   S/P laparoscopic assisted vaginal hysterectomy (LAVH)   Discharged Condition: good  Hospital Course: Admitted for LAVH, bilateral salpingectomy, cystoscopy performed without complications.  She was able to ambulate, tol PO and void, stable for discharge evening of surgery  Discharge Exam: Blood pressure (!) 112/52, pulse 72, temperature 98.7 F (37.1 C), resp. rate 18, height 5\' 6"  (1.676 m), weight 106.5 kg, last menstrual period 07/23/2018, SpO2 97 %. General appearance: alert  Disposition:   Discharge Instructions    Call MD for:  difficulty breathing, headache or visual disturbances   Complete by:  As directed    Call MD for:  persistant dizziness or light-headedness   Complete by:  As directed    Call MD for:  persistant nausea and vomiting   Complete by:  As directed    Call MD for:  severe uncontrolled pain   Complete by:  As directed    Call MD for:  temperature >100.4   Complete by:  As directed    Diet - low sodium heart healthy   Complete by:  As directed    Increase activity slowly   Complete by:  As directed    Lifting restrictions   Complete by:  As directed    10 lbs   Sexual Activity Restrictions   Complete by:  As directed    Pelvic rest     Allergies as of 07/30/2018      Reactions   Amoxicillin Anaphylaxis, Hives   Penicillins Anaphylaxis, Hives   Did it involve swelling of the face/tongue/throat, SOB, or low BP? Yes Did it involve sudden or severe rash/hives, skin peeling, or any reaction on the inside of your mouth or nose? Yes Did you need to seek medical attention at a hospital or doctor's office? Yes When did it last happen?Childhood  allergy If all above answers are "NO", may proceed with cephalosporin use.   Ultram [tramadol] Nausea And Vomiting   Passed out      Medication List    STOP taking these medications   CAMILA 0.35 MG tablet Generic drug:  norethindrone     TAKE these medications   cetirizine 10 MG tablet Commonly known as:  ZYRTEC Take 10 mg by mouth daily as needed for allergies.   gabapentin 300 MG capsule Commonly known as:  NEURONTIN Take 1 capsule (300 mg total) by mouth 3 (three) times daily.   ibuprofen 200 MG tablet Commonly known as:  ADVIL,MOTRIN Take 800 mg by mouth every 6 (six) hours as needed for pain.   naproxen 500 MG tablet Commonly known as:  NAPROSYN Take 500 mg by mouth 2 (two) times daily as needed for moderate pain.   oxyCODONE 5 MG immediate release tablet Commonly known as:  Oxy IR/ROXICODONE Take 1-2 tablets (5-10 mg total) by mouth every 4 (four) hours as needed for severe pain.   sulfamethoxazole-trimethoprim 800-160 MG tablet Commonly known as:  BACTRIM DS,SEPTRA DS Take 1 tablet by mouth 2 (two) times daily.      Follow-up Information    Izaya Netherton, MD. Schedule an appointment as soon as possible for a visit in 2 week(s).   Specialty:  Obstetrics and Gynecology Contact information: [address], [place] [address] [place] [address] [phone]  Signed: Leighton Roach Kristilyn Coltrane 07/31/2018, 8:26 AM

## 2019-03-24 IMAGING — DX DG CHEST 2V
2 series · 2 of 2 positions shown · non-contrast
Comparison: Chest radiograph March 21, 2011

CLINICAL DATA: Productive cough.

EXAM:
CHEST  2 VIEW

[chest pa]
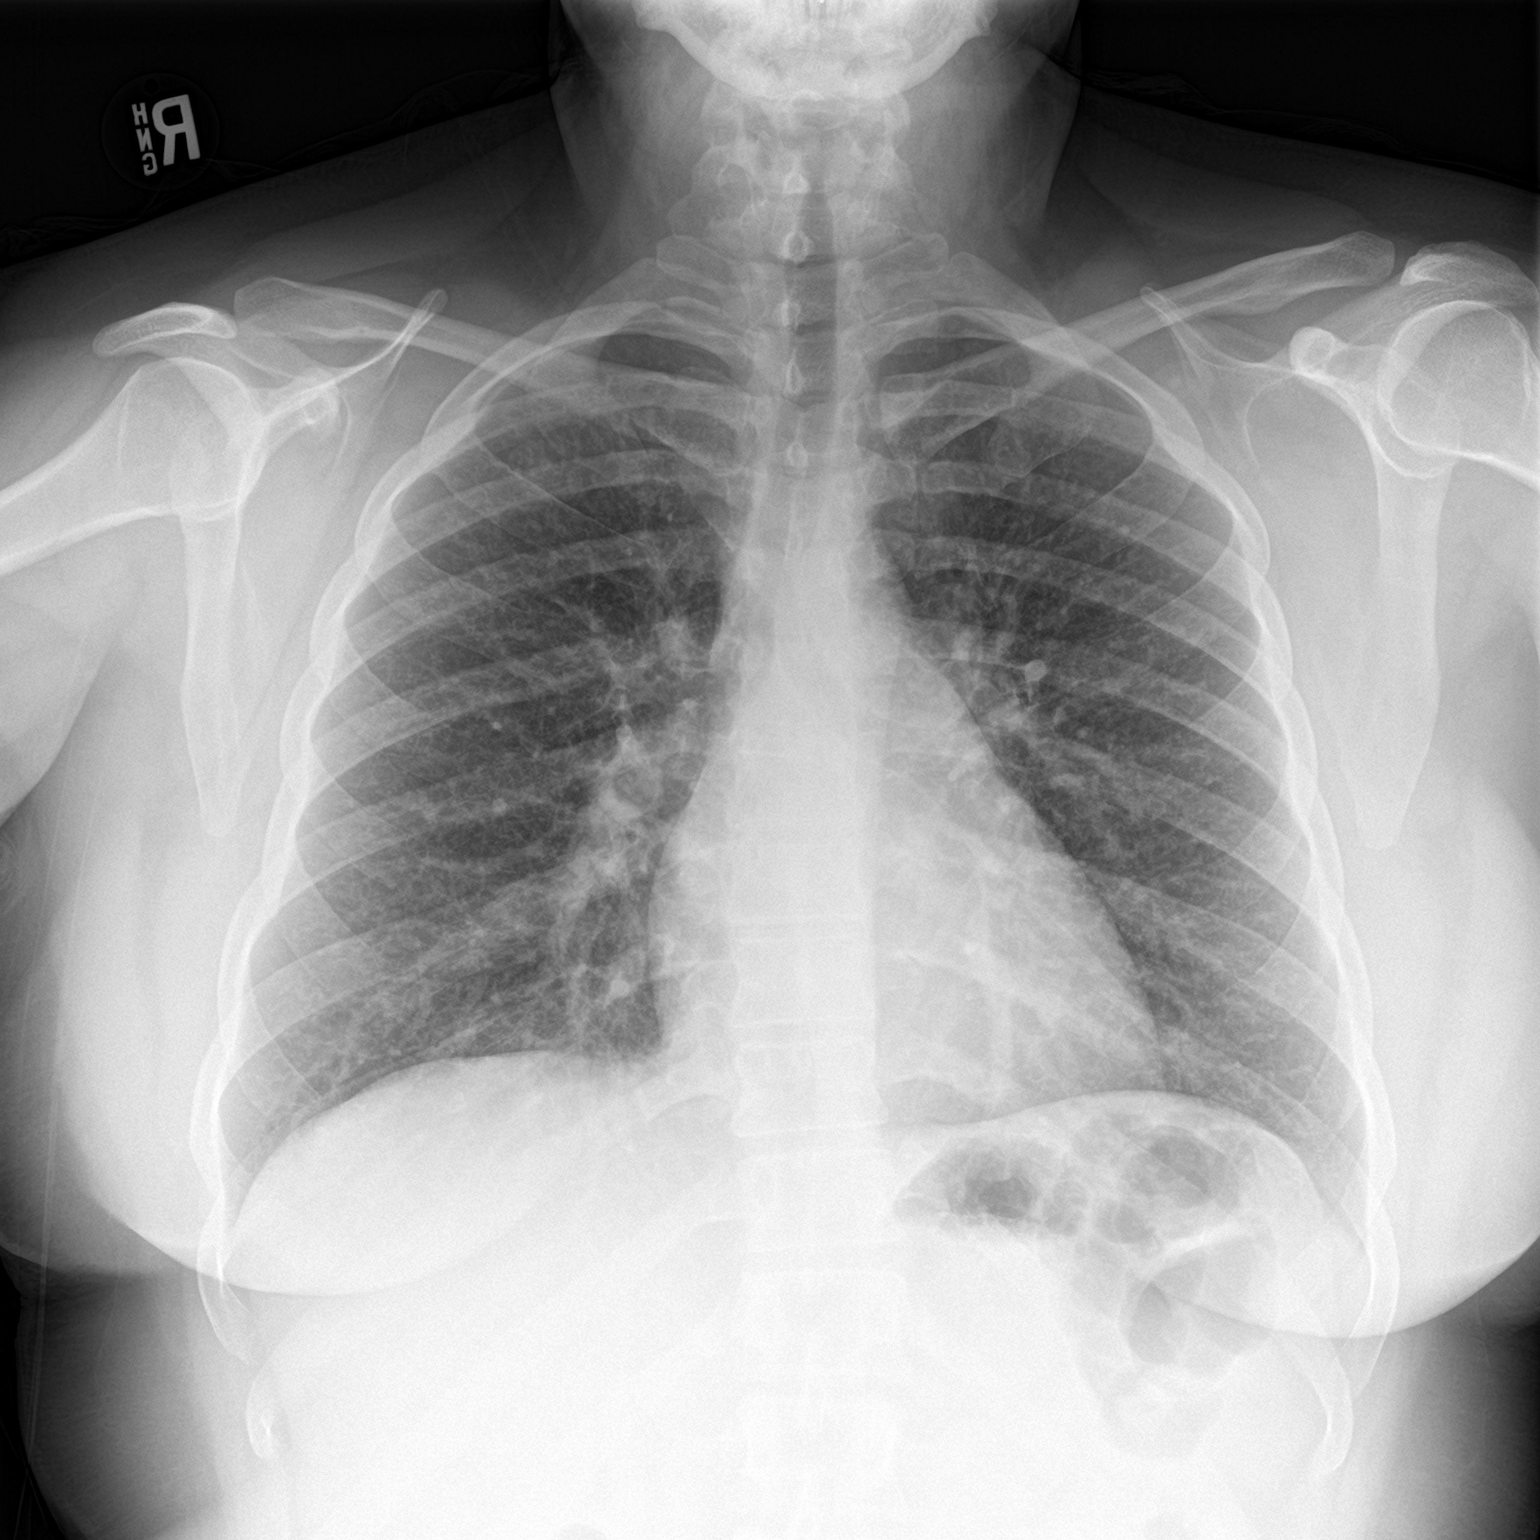

[chest lat]
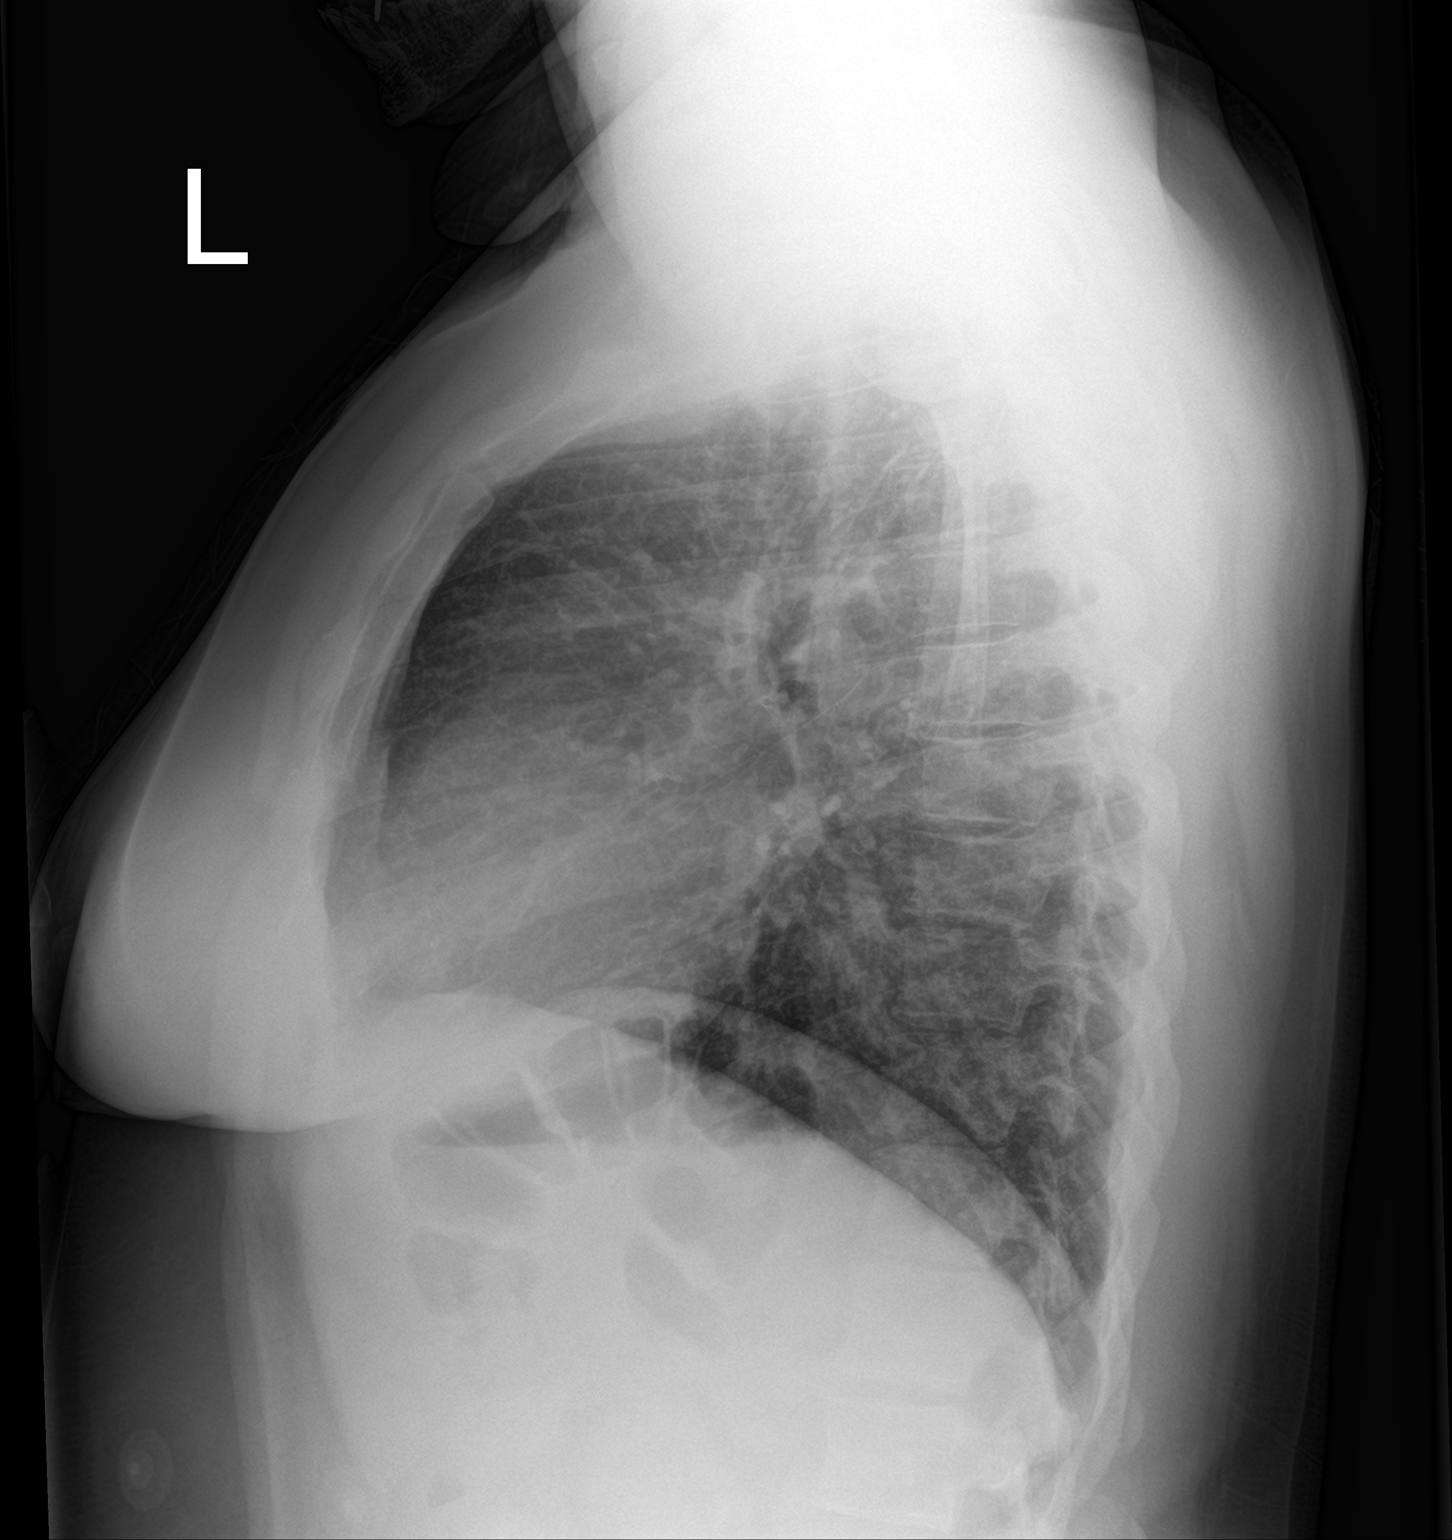

[2 of 2 positions shown; findings below may reference images not displayed]

FINDINGS: Cardiomediastinal silhouette is normal. No pleural effusions or
focal consolidations. Mild bronchitic changes. Trachea projects
midline and there is no pneumothorax. Soft tissue planes and
included osseous structures are non-suspicious.
IMPRESSION: Mild bronchitic changes without focal consolidation.

## 2019-11-14 ENCOUNTER — Encounter: Payer: Self-pay | Admitting: Cardiology

## 2019-12-14 ENCOUNTER — Ambulatory Visit: Payer: BLUE CROSS/BLUE SHIELD | Admitting: Cardiovascular Disease

## 2019-12-15 ENCOUNTER — Encounter: Payer: Self-pay | Admitting: *Deleted

## 2019-12-16 ENCOUNTER — Encounter: Payer: Self-pay | Admitting: Cardiology

## 2019-12-16 ENCOUNTER — Ambulatory Visit: Payer: BC Managed Care – PPO | Admitting: Cardiology

## 2019-12-16 ENCOUNTER — Telehealth: Payer: Self-pay | Admitting: Cardiology

## 2019-12-16 ENCOUNTER — Other Ambulatory Visit: Payer: Self-pay

## 2019-12-16 VITALS — BP 122/64 | HR 78 | Ht 66.0 in | Wt 241.0 lb

## 2019-12-16 DIAGNOSIS — I471 Supraventricular tachycardia: Secondary | ICD-10-CM | POA: Diagnosis not present

## 2019-12-16 MED ORDER — METOPROLOL SUCCINATE ER 25 MG PO TB24: 25.0000 mg | ORAL_TABLET | Freq: Every evening | ORAL | 1 refills | Status: DC

## 2019-12-16 NOTE — Progress Notes (Signed)
Cardiology Office Note  Date: 12/16/2019   ID: Stephanie Peterson, DOB 07-Jun-1986, MRN [id number]  PCP:  Patient, No Pcp Per  Cardiologist:  Nona Dell, MD Electrophysiologist:  None   Chief Complaint  Patient presents with  . History of PSVT    History of Present Illness: Stephanie Peterson is a 34 y.o. female referred for cardiology consultation by Dr. Paris Lore at [facility] ER with recent documentation of SVT.  Records indicate ER evaluation in May of this year with palpitations.  Faxed and reduced copy of ECG from that encounter shows SVT at 221 bpm with nonspecific ST segment changes, quality of tracing is not such that I can determine whether there are any retrograde P waves. Patient was treated with adenosine 6 mg and converted back to sinus rhythm.  Follow-up ECG showed normal sinus rhythm with normal intervals, no delta waves.  Case discussed with hospitalist who recommended admission, but patient left ER AMA, did not have childcare available at home.  She presents for consultation today.  States that her first episode of documented SVT was at age 65.  She has had several episodes since that time, at least once a year requiring ER evaluation with adenosine.  She has learned vagal maneuvers which otherwise have been reasonably effective for milder episodes.  She has not been on any medical therapy.  No recent echocardiogram.  No family history of cardiac arrhythmia.  She does not have a PCP at this time but does follow regularly with a gynecologist.  She is a Production designer, theatre/television/film at [employer] in [place].  I reviewed a previous tracing from 2020 as outlined below, sinus rhythm and no delta waves.   Past Medical History:  Diagnosis Date  . Abnormal uterine bleeding (AUB)   . Anemia   . Asthma   . History of depression   . PSVT (paroxysmal supraventricular tachycardia) (HCC)     Past Surgical History:  Procedure Laterality Date  . CYSTOSCOPY N/A 07/30/2018   Procedure:  CYSTOSCOPY;  Surgeon: Lavina Hamman, MD;  Location: [facility];  Service: Gynecology;  Laterality: N/A;  . LAPAROSCOPIC VAGINAL HYSTERECTOMY WITH SALPINGECTOMY Bilateral 07/30/2018   Procedure: LAPAROSCOPIC ASSISTED VAGINAL HYSTERECTOMY WITH SALPINGECTOMY;  Surgeon: Lavina Hamman, MD;  Location: [facility];  Service: Gynecology;  Laterality: Bilateral;  . TUBAL LIGATION    . WISDOM TOOTH EXTRACTION      Current Outpatient Medications  Medication Sig Dispense Refill  . ibuprofen (ADVIL,MOTRIN) 200 MG tablet Take 800 mg by mouth every 6 (six) hours as needed for pain.    . naproxen (NAPROSYN) 500 MG tablet Take 500 mg by mouth 2 (two) times daily as needed for moderate pain.    . metoprolol succinate (TOPROL XL) 25 MG 24 hr tablet Take 1 tablet (25 mg total) by mouth every evening. 90 tablet 1   No current facility-administered medications for this visit.   Allergies:  Amoxicillin, Penicillins, and Ultram [tramadol]   Social History: The patient  reports that she has been smoking cigarettes. She has a 4.00 pack-year smoking history. She has never used smokeless tobacco. She reports that she does not drink alcohol and does not use drugs.   Family History: The patient's family history includes Hypertension in her father.   ROS:   No chest pain or syncope.  Physical Exam: VS:  BP 122/64   Pulse 78   Ht 5\' 6"  (1.676 m)   Wt 241 lb (109.3 kg)  SpO2 98%   BMI 38.90 kg/m , BMI Body mass index is 38.9 kg/m.  Wt Readings from Last 3 Encounters:  12/16/19 241 lb (109.3 kg)  07/30/18 234 lb 11.2 oz (106.5 kg)  11/05/16 205 lb (93 kg)    General: Patient appears comfortable at rest. HEENT: Conjunctiva and lids normal, wearing a mask. Neck: Supple, no elevated JVP or carotid bruits, no thyromegaly. Lungs: Clear to auscultation, nonlabored breathing at rest. Cardiac: Regular rate and rhythm, no S3 or significant systolic murmur, no pericardial  rub. Abdomen: Bowel sounds present. Extremities: No pitting edema, distal pulses 2+. Skin: Warm and dry. Musculoskeletal: No kyphosis. Neuropsychiatric: Alert and oriented x3, affect grossly appropriate.  ECG:  An ECG dated 07/23/2018 was personally reviewed today and demonstrated:  Normal sinus rhythm.  Recent Labwork:  May 2021: Potassium 3.9, BUN 10, creatinine 0.8, magnesium 1.9, TSH 1.286, hemoglobin 15.1, platelets 349  Other Studies Reviewed Today:  No prior cardiac testing for review today.  Assessment and Plan:  PSVT as described above.  Baseline ECG in sinus rhythm shows normal intervals and no delta waves.  She has had recurring, impressive symptoms over the years with need for adenosine management, but also has been using vagal maneuvers for milder episodes.  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  Plan is to start Toprol-XL 25 mg daily and obtain an echocardiogram.  We will also go ahead and establish her with EP service as I suspect radiofrequency ablation would be an effective management option ultimately.  Medication Adjustments/Labs and Tests Ordered: Current medicines are reviewed at length with the patient today.  Concerns regarding medicines are outlined above.   Tests Ordered: Orders Placed This Encounter  Procedures  . Ambulatory referral to Cardiac Electrophysiology  . EKG 12-Lead  . ECHOCARDIOGRAM COMPLETE    Medication Changes: Meds ordered this encounter  Medications  . metoprolol succinate (TOPROL XL) 25 MG 24 hr tablet    Sig: Take 1 tablet (25 mg total) by mouth every evening.    Dispense:  90 tablet    Refill:  1    12/16/2019 NEW    Disposition:  Follow up 6 months in the [place] office.  Signed, Jonelle Sidle, MD, [facility] 12/16/2019 9:27 AM    [facility] at [facility] [address] [place], [place], [place] [address] Phone: [phone]; Fax:  [phone]

## 2019-12-16 NOTE — Telephone Encounter (Signed)
Pre-cert Verification for the following procedure    ECHO   DATE:  12/23/2019  LOCATION:[facility]

## 2019-12-16 NOTE — Patient Instructions (Addendum)
Medication Instructions:    Your physician has recommended you make the following change in your medication:   Start metoprolol succinate (Toprol XL) 25 mg daily in the evening  Continue other medications the same  Labwork:  NONE  Testing/Procedures: Your physician has requested that you have an echocardiogram. Echocardiography is a painless test that uses sound waves to create images of your heart. It provides your doctor with information about the size and shape of your heart and how well your heart's chambers and valves are working. This procedure takes approximately one hour. There are no restrictions for this procedure.  Follow-Up:  Your physician recommends that you schedule a follow-up appointment in: 6 months (office). You will receive a reminder letter in the mail in about 4 months reminding you to call and schedule your appointment. If you don't receive this letter, please contact our office.  Any Other Special Instructions Will Be Listed Below (If Applicable).  You have been referred to an EP cardiologist.  If you need a refill on your cardiac medications before your next appointment, please call your pharmacy.

## 2019-12-23 ENCOUNTER — Other Ambulatory Visit: Payer: Self-pay

## 2019-12-23 ENCOUNTER — Ambulatory Visit (HOSPITAL_COMMUNITY)
Admission: RE | Admit: 2019-12-23 | Discharge: 2019-12-23 | Disposition: A | Discharge status: Home / Self Care | Payer: BC Managed Care – PPO | Source: Ambulatory Visit | Attending: Cardiology | Admitting: Cardiology

## 2019-12-23 DIAGNOSIS — I471 Supraventricular tachycardia: Secondary | ICD-10-CM | POA: Insufficient documentation

## 2019-12-23 NOTE — Progress Notes (Signed)
*  PRELIMINARY RESULTS* Echocardiogram 2D Echocardiogram has been performed.  Stephanie Peterson 12/23/2019, 3:56 PM

## 2019-12-27 ENCOUNTER — Ambulatory Visit: Payer: BC Managed Care – PPO | Admitting: Internal Medicine

## 2019-12-27 ENCOUNTER — Encounter: Payer: Self-pay | Admitting: Internal Medicine

## 2019-12-27 ENCOUNTER — Encounter: Payer: Self-pay | Admitting: *Deleted

## 2019-12-27 ENCOUNTER — Other Ambulatory Visit: Payer: Self-pay

## 2019-12-27 ENCOUNTER — Telehealth: Payer: Self-pay

## 2019-12-27 VITALS — BP 108/60 | HR 88 | Ht 66.0 in | Wt 236.0 lb

## 2019-12-27 DIAGNOSIS — I471 Supraventricular tachycardia, unspecified: Secondary | ICD-10-CM

## 2019-12-27 NOTE — Telephone Encounter (Signed)
New message    Patient called in left vm for a nurse to call her back , she call back a 2nd time and I spoke with her and she was calling to schedule her ablation. I told patient I would have nurse call her back regarding procedure , she would like to have August 2nd, 2021  Thank you

## 2019-12-27 NOTE — Addendum Note (Signed)
Addended by: Kerney Elbe on: 12/27/2019 05:24 PM   Modules accepted: Orders

## 2019-12-27 NOTE — H&P (View-Only) (Signed)
HPI Stephanie Peterson is referred by Dr. Diona Browner for consideration for catheter ablation. She is a pleasant 34 yo woman with SVT which occurs several times a month. She can usually break the spells with valsalva maneuvers but has required adenosine on a number of occaisions. She has not had syncope but gets sob. No relation to caffeine. She has been treated with toprol but has continued to have episodes though her HR's in the 160-170.  Allergies  Allergen Reactions  . Amoxicillin Anaphylaxis and Hives  . Penicillins Anaphylaxis and Hives    Did it involve swelling of the face/tongue/throat, SOB, or low BP? Yes Did it involve sudden or severe rash/hives, skin peeling, or any reaction on the inside of your mouth or nose? Yes Did you need to seek medical attention at a hospital or doctor's office? Yes When did it last happen?Childhood allergy If all above answers are "NO", may proceed with cephalosporin use.   Marland Kitchen Ultram [Tramadol] Nausea And Vomiting    Passed out     Current Outpatient Medications  Medication Sig Dispense Refill  . ibuprofen (ADVIL,MOTRIN) 200 MG tablet Take 800 mg by mouth every 6 (six) hours as needed for pain.    . metoprolol succinate (TOPROL XL) 25 MG 24 hr tablet Take 1 tablet (25 mg total) by mouth every evening. 90 tablet 1  . naproxen (NAPROSYN) 500 MG tablet Take 500 mg by mouth 2 (two) times daily as needed for moderate pain.     No current facility-administered medications for this visit.     Past Medical History:  Diagnosis Date  . Abnormal uterine bleeding (AUB)   . Anemia   . Asthma   . History of depression   . PSVT (paroxysmal supraventricular tachycardia) (HCC)     ROS:   All systems reviewed and negative except as noted in the HPI.   Past Surgical History:  Procedure Laterality Date  . CYSTOSCOPY N/A 07/30/2018   Procedure: CYSTOSCOPY;  Surgeon: Lavina Hamman, MD;  Location: [facility];  Service: Gynecology;   Laterality: N/A;  . LAPAROSCOPIC VAGINAL HYSTERECTOMY WITH SALPINGECTOMY Bilateral 07/30/2018   Procedure: LAPAROSCOPIC ASSISTED VAGINAL HYSTERECTOMY WITH SALPINGECTOMY;  Surgeon: Lavina Hamman, MD;  Location: [facility];  Service: Gynecology;  Laterality: Bilateral;  . TUBAL LIGATION    . WISDOM TOOTH EXTRACTION       Family History  Problem Relation Age of Onset  . Hypertension Father      Social History   Socioeconomic History  . Marital status: Married    Spouse name: Not on file  . Number of children: Not on file  . Years of education: Not on file  . Highest education level: Not on file  Occupational History  . Not on file  Tobacco Use  . Smoking status: Current Every Day Smoker    Packs/day: 0.50    Years: 8.00    Pack years: 4.00    Types: Cigarettes  . Smokeless tobacco: Never Used  Substance and Sexual Activity  . Alcohol use: No  . Drug use: No  . Sexual activity: Not on file  Other Topics Concern  . Not on file  Social History Narrative  . Not on file   Social Determinants of Health   Financial Resource Strain:   . Difficulty of Paying Living Expenses:   Food Insecurity:   . Worried About Programme researcher, broadcasting/film/video in the Last Year:   . [employer]  in the Last Year:   Transportation Needs:   . Freight forwarder (Medical):   Marland Kitchen Lack of Transportation (Non-Medical):   Physical Activity:   . Days of Exercise per Week:   . Minutes of Exercise per Session:   Stress:   . Feeling of Stress :   Social Connections:   . Frequency of Communication with Friends and Family:   . Frequency of Social Gatherings with Friends and Family:   . Attends Religious Services:   . Active Member of Clubs or Organizations:   . Attends Banker Meetings:   Marland Kitchen Marital Status:   Intimate Partner Violence:   . Fear of Current or Ex-Partner:   . Emotionally Abused:   Marland Kitchen Physically Abused:   . Sexually Abused:      BP 108/60   Pulse 88   Ht  5\' 6"  (1.676 m)   Wt 236 lb (107 kg)   SpO2 96%   BMI 38.09 kg/m   Physical Exam:  Well appearing NAD HEENT: Unremarkable Neck:  No JVD, no thyromegally Lymphatics:  No adenopathy Back:  No CVA tenderness Lungs:  Clear with no wheezes HEART:  Regular rate rhythm, no murmurs, no rubs, no clicks Abd:  soft, positive bowel sounds, no organomegally, no rebound, no guarding Ext:  2 plus pulses, no edema, no cyanosis, no clubbing Skin:  No rashes no nodules Neuro:  CN II through XII intact, motor grossly intact  Assess/Plan: 1. SVT - I have discussed the treatment options with the patient. She has symptomatic SVT despite medical therapy, adenosine sensitive. I have reviewed the risks/ benefits/goals/expectations of catheter ablation and she will call if she wishes to proceed.  [place].D.

## 2019-12-27 NOTE — Patient Instructions (Signed)
Medication Instructions:  Your physician recommends that you continue on your current medications as directed. Please refer to the Current Medication list given to you today.  *If you need a refill on your cardiac medications before your next appointment, please call your pharmacy*   Lab Work: NONE  If you have labs (blood work) drawn today and your tests are completely normal, you will receive your results only by:  MyChart Message (if you have MyChart) OR  A paper copy in the mail If you have any lab test that is abnormal or we need to change your treatment, we will call you to review the results.   Testing/Procedures: Your physician has recommended that you have an ablation. Catheter ablation is a medical procedure used to treat some cardiac arrhythmias (irregular heartbeats). During catheter ablation, a long, thin, flexible tube is put into a blood vessel in your groin (upper thigh), or neck. This tube is called an ablation catheter. It is then guided to your heart through the blood vessel. Radio frequency waves destroy small areas of heart tissue where abnormal heartbeats may cause an arrhythmia to start. Please see the instruction sheet given to you today. Dates July 16, 26,29  August 2, 5      Follow-Up: At [facility], you and your health needs are our priority.  As part of our continuing mission to provide you with exceptional heart care, we have created designated Provider Care Teams.  These Care Teams include your primary Cardiologist (physician) and Advanced Practice Providers (APPs -  Physician Assistants and Nurse Practitioners) who all work together to provide you with the care you need, when you need it.  We recommend signing up for the patient portal called "MyChart".  Sign up information is provided on this After Visit Summary.  MyChart is used to connect with patients for Virtual Visits (Telemedicine).  Patients are able to view lab/test results, encounter notes,  upcoming appointments, etc.  Non-urgent messages can be sent to your provider as well.   To learn more about what you can do with MyChart, go to [email or web address].    Your next appointment:    Dates given for Ablation   The format for your next appointment:   In Person  Provider:   Lewayne Bunting, MD   Other Instructions Thank you for choosing [facility]!

## 2019-12-27 NOTE — Telephone Encounter (Signed)
Are you working on this for Dr. Ladona Ridgel patient?

## 2019-12-27 NOTE — Progress Notes (Signed)
    HPI Ms. Stephanie Peterson is referred by Dr. McDowell for consideration for catheter ablation. She is a pleasant 33 yo woman with SVT which occurs several times a month. She can usually break the spells with valsalva maneuvers but has required adenosine on a number of occaisions. She has not had syncope but gets sob. No relation to caffeine. She has been treated with toprol but has continued to have episodes though her HR's in the 160-170.  Allergies  Allergen Reactions  . Amoxicillin Anaphylaxis and Hives  . Penicillins Anaphylaxis and Hives    Did it involve swelling of the face/tongue/throat, SOB, or low BP? Yes Did it involve sudden or severe rash/hives, skin peeling, or any reaction on the inside of your mouth or nose? Yes Did you need to seek medical attention at a hospital or doctor's office? Yes When did it last happen?Childhood allergy If all above answers are "NO", may proceed with cephalosporin use.   . Ultram [Tramadol] Nausea And Vomiting    Passed out     Current Outpatient Medications  Medication Sig Dispense Refill  . ibuprofen (ADVIL,MOTRIN) 200 MG tablet Take 800 mg by mouth every 6 (six) hours as needed for pain.    . metoprolol succinate (TOPROL XL) 25 MG 24 hr tablet Take 1 tablet (25 mg total) by mouth every evening. 90 tablet 1  . naproxen (NAPROSYN) 500 MG tablet Take 500 mg by mouth 2 (two) times daily as needed for moderate pain.     No current facility-administered medications for this visit.     Past Medical History:  Diagnosis Date  . Abnormal uterine bleeding (AUB)   . Anemia   . Asthma   . History of depression   . PSVT (paroxysmal supraventricular tachycardia) (HCC)     ROS:   All systems reviewed and negative except as noted in the HPI.   Past Surgical History:  Procedure Laterality Date  . CYSTOSCOPY N/A 07/30/2018   Procedure: CYSTOSCOPY;  Surgeon: Meisinger, Todd, MD;  Location: [facility];  Service: Gynecology;   Laterality: N/A;  . LAPAROSCOPIC VAGINAL HYSTERECTOMY WITH SALPINGECTOMY Bilateral 07/30/2018   Procedure: LAPAROSCOPIC ASSISTED VAGINAL HYSTERECTOMY WITH SALPINGECTOMY;  Surgeon: Meisinger, Todd, MD;  Location: [facility];  Service: Gynecology;  Laterality: Bilateral;  . TUBAL LIGATION    . WISDOM TOOTH EXTRACTION       Family History  Problem Relation Age of Onset  . Hypertension Father      Social History   Socioeconomic History  . Marital status: Married    Spouse name: Not on file  . Number of children: Not on file  . Years of education: Not on file  . Highest education level: Not on file  Occupational History  . Not on file  Tobacco Use  . Smoking status: Current Every Day Smoker    Packs/day: 0.50    Years: 8.00    Pack years: 4.00    Types: Cigarettes  . Smokeless tobacco: Never Used  Substance and Sexual Activity  . Alcohol use: No  . Drug use: No  . Sexual activity: Not on file  Other Topics Concern  . Not on file  Social History Narrative  . Not on file   Social Determinants of Health   Financial Resource Strain:   . Difficulty of Paying Living Expenses:   Food Insecurity:   . Worried About Running Out of Food in the Last Year:   . Ran Out of Food   in the Last Year:   Transportation Needs:   . Freight forwarder (Medical):   Marland Kitchen Lack of Transportation (Non-Medical):   Physical Activity:   . Days of Exercise per Week:   . Minutes of Exercise per Session:   Stress:   . Feeling of Stress :   Social Connections:   . Frequency of Communication with Friends and Family:   . Frequency of Social Gatherings with Friends and Family:   . Attends Religious Services:   . Active Member of Clubs or Organizations:   . Attends Banker Meetings:   Marland Kitchen Marital Status:   Intimate Partner Violence:   . Fear of Current or Ex-Partner:   . Emotionally Abused:   Marland Kitchen Physically Abused:   . Sexually Abused:      BP 108/60   Pulse 88   Ht  5\' 6"  (1.676 m)   Wt 236 lb (107 kg)   SpO2 96%   BMI 38.09 kg/m   Physical Exam:  Well appearing NAD HEENT: Unremarkable Neck:  No JVD, no thyromegally Lymphatics:  No adenopathy Back:  No CVA tenderness Lungs:  Clear with no wheezes HEART:  Regular rate rhythm, no murmurs, no rubs, no clicks Abd:  soft, positive bowel sounds, no organomegally, no rebound, no guarding Ext:  2 plus pulses, no edema, no cyanosis, no clubbing Skin:  No rashes no nodules Neuro:  CN II through XII intact, motor grossly intact  Assess/Plan: 1. SVT - I have discussed the treatment options with the patient. She has symptomatic SVT despite medical therapy, adenosine sensitive. I have reviewed the risks/ benefits/goals/expectations of catheter ablation and she will call if she wishes to proceed.  [place].D.

## 2019-12-28 ENCOUNTER — Telehealth: Payer: Self-pay | Admitting: *Deleted

## 2019-12-28 NOTE — Telephone Encounter (Signed)
Pt notified of date and time of Ablation

## 2019-12-28 NOTE — Telephone Encounter (Signed)
-----   Message from Jonelle Sidle, MD sent at 12/23/2019  6:10 PM EDT ----- Results reviewed.  Cardiac function is normal with LVEF 60 to 65%, no significant valvular abnormalities.  Continue with current plan.

## 2019-12-28 NOTE — Telephone Encounter (Signed)
Patient informed. 

## 2020-01-05 ENCOUNTER — Other Ambulatory Visit: Payer: Self-pay

## 2020-01-05 ENCOUNTER — Other Ambulatory Visit (HOSPITAL_COMMUNITY)
Admission: RE | Admit: 2020-01-05 | Discharge: 2020-01-05 | Disposition: A | Discharge status: Home / Self Care | Payer: BC Managed Care – PPO | Source: Ambulatory Visit | Attending: Internal Medicine | Admitting: Internal Medicine

## 2020-01-05 ENCOUNTER — Other Ambulatory Visit: Payer: BC Managed Care – PPO

## 2020-01-05 DIAGNOSIS — I471 Supraventricular tachycardia: Secondary | ICD-10-CM | POA: Insufficient documentation

## 2020-01-05 LAB — BASIC METABOLIC PANEL
Anion gap: 8 (ref 5–15)
BUN: 11 mg/dL (ref 6–20)
CO2: 24 mmol/L (ref 22–32)
Calcium: 9.1 mg/dL (ref 8.9–10.3)
Chloride: 106 mmol/L (ref 98–111)
Creatinine, Ser: 0.62 mg/dL (ref 0.44–1.00)
GFR calc Af Amer: >60 mL/min (ref >60)
GFR calc non Af Amer: >60 mL/min (ref >60)
Glucose, Bld: 96 mg/dL (ref 70–99)
Potassium: 3.8 mmol/L (ref 3.5–5.1)
Sodium: 138 mmol/L (ref 135–145)

## 2020-01-05 LAB — CBC
HCT: 42.9 % (ref 36.0–46.0)
Hemoglobin: 14.4 g/dL (ref 12.0–15.0)
MCH: 30.6 pg (ref 26.0–34.0)
MCHC: 33.6 g/dL (ref 30.0–36.0)
MCV: 91.1 fL (ref 80.0–100.0)
Platelets: 253 10*3/uL (ref 150–400)
RBC: 4.71 MIL/uL (ref 3.87–5.11)
RDW: 13.3 % (ref 11.5–15.5)
WBC: 8.6 10*3/uL (ref 4.0–10.5)
nRBC: 0 % (ref 0.0–0.2)

## 2020-01-17 ENCOUNTER — Encounter (HOSPITAL_COMMUNITY)
Admission: RE | Disposition: A | Discharge status: Home / Self Care | Payer: Self-pay | Source: Home / Self Care | Attending: Internal Medicine

## 2020-01-17 ENCOUNTER — Ambulatory Visit (HOSPITAL_COMMUNITY)
Admission: RE | Admit: 2020-01-17 | Discharge: 2020-01-17 | Disposition: A | Discharge status: Home / Self Care | Payer: BC Managed Care – PPO | Attending: Internal Medicine | Admitting: Internal Medicine

## 2020-01-17 DIAGNOSIS — Z79899 Other long term (current) drug therapy: Secondary | ICD-10-CM | POA: Insufficient documentation

## 2020-01-17 DIAGNOSIS — F17210 Nicotine dependence, cigarettes, uncomplicated: Secondary | ICD-10-CM | POA: Diagnosis not present

## 2020-01-17 DIAGNOSIS — I471 Supraventricular tachycardia, unspecified: Secondary | ICD-10-CM | POA: Diagnosis not present

## 2020-01-17 DIAGNOSIS — J45909 Unspecified asthma, uncomplicated: Secondary | ICD-10-CM | POA: Diagnosis not present

## 2020-01-17 HISTORY — PX: SVT ABLATION: EP1225

## 2020-01-17 SURGERY — SVT ABLATION

## 2020-01-17 MED ORDER — SODIUM CHLORIDE 0.9% FLUSH: 3.0000 mL | Freq: Two times a day (BID) | INTRAVENOUS | Status: DC

## 2020-01-17 MED ORDER — ONDANSETRON HCL 4 MG/2ML IJ SOLN: 4.0000 mg | Freq: Four times a day (QID) | INTRAMUSCULAR | Status: DC | PRN

## 2020-01-17 MED ORDER — SODIUM CHLORIDE 0.9% FLUSH: 3.0000 mL | INTRAVENOUS | Status: DC | PRN

## 2020-01-17 MED ORDER — SODIUM CHLORIDE 0.9 % IV SOLN: INTRAVENOUS | Status: DC

## 2020-01-17 MED ORDER — FENTANYL CITRATE (PF) 100 MCG/2ML IJ SOLN
INTRAMUSCULAR | Status: AC
  Filled 2020-01-17: qty 2

## 2020-01-17 MED ORDER — MIDAZOLAM HCL 5 MG/5ML IJ SOLN
INTRAMUSCULAR | Status: AC
  Filled 2020-01-17: qty 5

## 2020-01-17 MED ORDER — SODIUM CHLORIDE 0.9 % IV SOLN: 250.0000 mL | INTRAVENOUS | Status: DC | PRN

## 2020-01-17 MED ORDER — BUPIVACAINE HCL (PF) 0.25 % IJ SOLN
INTRAMUSCULAR | Status: AC
  Filled 2020-01-17: qty 30

## 2020-01-17 MED ORDER — FENTANYL CITRATE (PF) 100 MCG/2ML IJ SOLN
INTRAMUSCULAR | Status: DC | PRN
  Administered 2020-01-17 (×2): 25 ug via INTRAVENOUS
  Administered 2020-01-17 (×3): 12.5 ug via INTRAVENOUS

## 2020-01-17 MED ORDER — ACETAMINOPHEN 325 MG PO TABS: 650.0000 mg | ORAL_TABLET | ORAL | Status: DC | PRN

## 2020-01-17 MED ORDER — BUPIVACAINE HCL (PF) 0.25 % IJ SOLN
INTRAMUSCULAR | Status: DC | PRN
  Administered 2020-01-17: 30 mL

## 2020-01-17 MED ORDER — MIDAZOLAM HCL 5 MG/5ML IJ SOLN
INTRAMUSCULAR | Status: DC | PRN
  Administered 2020-01-17: 2 mg via INTRAVENOUS
  Administered 2020-01-17 (×5): 1 mg via INTRAVENOUS

## 2020-01-17 MED ORDER — HEPARIN (PORCINE) IN NACL 1000-0.9 UT/500ML-% IV SOLN
INTRAVENOUS | Status: DC | PRN
  Administered 2020-01-17: 500 mL

## 2020-01-17 SURGICAL SUPPLY — 10 items
BAG SNAP BAND KOVER 36X36 (MISCELLANEOUS) ×3 IMPLANT
CATH CELSIUS THERMO F CV 7FR (ABLATOR) ×3 IMPLANT
CATH HEX JOS 2-5-2 65CM 6F REP (CATHETERS) ×3 IMPLANT
CATH JOSEPH QUAD ALLRED 6F REP (CATHETERS) ×6 IMPLANT
PACK EP LATEX FREE (CUSTOM PROCEDURE TRAY) ×3
PACK EP LF (CUSTOM PROCEDURE TRAY) ×1 IMPLANT
PAD PRO RADIOLUCENT 2001M-C (PAD) ×3 IMPLANT
SHEATH PINNACLE 6F 10CM (SHEATH) ×6 IMPLANT
SHEATH PINNACLE 7F 10CM (SHEATH) ×3 IMPLANT
SHEATH PINNACLE 8F 10CM (SHEATH) ×3 IMPLANT

## 2020-01-17 NOTE — Discharge Instructions (Signed)
Post procedure care instructions No driving for 4 days. No lifting over 5 lbs for 1 week. No vigorous or sexual activity for 1 week. You may return to work/your usual activities on 01/24/2020. Keep procedure site clean & dry. If you notice increased pain, swelling, bleeding or pus, call/return!  You may shower, but no soaking baths/hot tubs/pools for 1 week.   Cardiac Ablation, Care After  This sheet gives you information about how to care for yourself after your procedure. Your health care provider may also give you more specific instructions. If you have problems or questions, contact your health care provider. What can I expect after the procedure? After the procedure, it is common to have:  Bruising around your puncture site.  Tenderness around your puncture site.  Skipped heartbeats.  Tiredness (fatigue).  Follow these instructions at home: Puncture site care   Follow instructions from your health care provider about how to take care of your puncture site. Make sure you: ? If present, leave stitches (sutures), skin glue, or adhesive strips in place. These skin closures may need to stay in place for up to 2 weeks. If adhesive strip edges start to loosen and curl up, you may trim the loose edges. Do not remove adhesive strips completely unless your health care provider tells you to do that.  Check your puncture site every day for signs of infection. Check for: ? Redness, swelling, or pain. ? Fluid or blood. If your puncture site starts to bleed, lie down on your back, apply firm pressure to the area, and contact your health care provider. ? Warmth. ? Pus or a bad smell. Driving  Do not drive for at least 4 days after your procedure or however long your health care provider recommends. (Do not resume driving if you have previously been instructed not to drive for other health reasons.)  Do not drive or use heavy machinery while taking prescription pain medicine. Activity  Avoid  activities that take a lot of effort for at least 7 days after your procedure.  Do not lift anything that is heavier than 5 lb (4.5 kg) for one week.   No sexual activity for 1 week.   Return to your normal activities as told by your health care provider. Ask your health care provider what activities are safe for you. General instructions  Take over-the-counter and prescription medicines only as told by your health care provider.  Do not use any products that contain nicotine or tobacco, such as cigarettes and e-cigarettes. If you need help quitting, ask your health care provider.  You may shower after 24 hours, but Do not take baths, swim, or use a hot tub for 1 week.   Do not drink alcohol for 24 hours after your procedure.  Keep all follow-up visits as told by your health care provider. This is important. Contact a health care provider if:  You have redness, mild swelling, or pain around your puncture site.  You have fluid or blood coming from your puncture site that stops after applying firm pressure to the area.  Your puncture site feels warm to the touch.  You have pus or a bad smell coming from your puncture site.  You have a fever.  You have chest pain or discomfort that spreads to your neck, jaw, or arm.  You are sweating a lot.  You feel nauseous.  You have a fast or irregular heartbeat.  You have shortness of breath.  You are dizzy or light-headed  and feel the need to lie down.  You have pain or numbness in the arm or leg closest to your puncture site. Get help right away if:  Your puncture site suddenly swells.  Your puncture site is bleeding and the bleeding does not stop after applying firm pressure to the area. These symptoms may represent a serious problem that is an emergency. Do not wait to see if the symptoms will go away. Get medical help right away. Call your local emergency services (911 in the [place] Do not drive yourself to the  hospital. Summary  After the procedure, it is normal to have bruising and tenderness at the puncture site in your groin, neck, or forearm.  Check your puncture site every day for signs of infection.  Get help right away if your puncture site is bleeding and the bleeding does not stop after applying firm pressure to the area. This is a medical emergency. This information is not intended to replace advice given to you by your health care provider. Make sure you discuss any questions you have with your health care provider.

## 2020-01-17 NOTE — Progress Notes (Signed)
Site area: Right groin a 6,7, and 8 french venous sheath was removed  Site Prior to Removal:  Level 0  Pressure Applied For 15 MINUTES    Bedrest Beginning at 1145am  Manual:   Yes.    Patient Status During Pull:  stable  Post Pull Groin Site:  Level 0  Post Pull Instructions Given:  Yes.    Post Pull Pulses Present:  Yes.    Dressing Applied:  Yes.    Comments:

## 2020-01-17 NOTE — Interval H&P Note (Signed)
History and Physical Interval Note:  01/17/2020 9:13 AM  Stephanie Peterson  has presented today for surgery, with the diagnosis of svt.  The various methods of treatment have been discussed with the patient and family. After consideration of risks, benefits and other options for treatment, the patient has consented to  Procedure(s): SVT ABLATION (N/A) as a surgical intervention.  The patient's history has been reviewed, patient examined, no change in status, stable for surgery.  I have reviewed the patient's chart and labs.  Questions were answered to the patient's satisfaction.     Lewayne Bunting

## 2020-01-18 ENCOUNTER — Encounter (HOSPITAL_COMMUNITY): Payer: Self-pay | Admitting: Internal Medicine

## 2020-02-24 ENCOUNTER — Ambulatory Visit: Payer: BC Managed Care – PPO | Admitting: Internal Medicine

## 2022-04-03 ENCOUNTER — Encounter: Payer: Self-pay | Admitting: *Deleted

## 2022-04-03 ENCOUNTER — Ambulatory Visit: Payer: BC Managed Care – PPO | Admitting: *Deleted

## 2022-04-03 NOTE — Patient Instructions (Signed)
Visit Information  Thank you for taking time to visit with me today. Please don't hesitate to contact me if I can be of assistance to you.   Please call the care guide team at [phone] if you need to cancel or reschedule your appointment.   If you are experiencing a Mental Health or Behavioral Health Crisis or need someone to talk to, please call the Suicide and Crisis Lifeline: 988 call the [place] National Suicide Prevention Lifeline: 1-800-273-8255 or TTY: 1-800-799-4 TTY (1-800-799-4889) to talk to a trained counselor call 1-800-273-TALK (toll free, 24 hour hotline) go to [facility] [address], [place] ([phone]) call the [place] Crisis Line: 800-939-9988 call 911  Patient verbalizes understanding of instructions and care plan provided today and agrees to view in MyChart. Active MyChart status and patient understanding of how to access instructions and care plan via MyChart confirmed with patient.     No further follow up required.  Nyemah Watton, BSW, MSW, LCSW  Licensed Clinical Social Worker  [facility]  Mailing Address-[address], [place], [place] [address] Physical Address-[address], [place], [place] [address] Toll Free Main # [phone] Fax # [phone] Cell # [phone] [email or web address]            

## 2022-04-03 NOTE — Patient Outreach (Signed)
  Care Coordination   Initial Visit Note   04/03/2022  Name: ANNJANETTE WERTENBERGER MRN: [id number] DOB: [birth year]  Shari Prows is a 36 y.o. year old female who sees Dr. Rozann Lesches for primary care. I spoke with Shari Prows by phone today.  What matters to the patients health and wellness today?  No Interventions Identified.   SDOH assessments and interventions completed:  Yes.  SDOH Interventions Today    Flowsheet Row Most Recent Value  SDOH Interventions   Food Insecurity Interventions Intervention Not Indicated  Housing Interventions Intervention Not Indicated  Transportation Interventions Intervention Not Indicated  Utilities Interventions Intervention Not Indicated  Alcohol Usage Interventions Intervention Not Indicated (Score <7)  Financial Strain Interventions Intervention Not Indicated  Physical Activity Interventions Patient Refused  Stress Interventions Intervention Not Indicated  Social Connections Interventions Intervention Not Indicated      Care Coordination Interventions Activated:  Yes.   Care Coordination Interventions:  Yes, provided.   Follow up plan: No further intervention required.   Encounter Outcome:  Pt. Visit Completed.   Nat Christen, BSW, MSW, LCSW  Licensed Education officer, [facility]  Mailing [place] [address] [place], [place] [address] Physical Address-[address], [place], [place] [address] Toll Free Main # [phone] Fax # [phone] Cell # [phone] [email or web address]

## 2022-07-09 ENCOUNTER — Other Ambulatory Visit: Payer: Self-pay

## 2022-07-09 ENCOUNTER — Emergency Department (HOSPITAL_COMMUNITY): Payer: BC Managed Care – PPO

## 2022-07-09 ENCOUNTER — Emergency Department (HOSPITAL_COMMUNITY)
Admission: EM | Admit: 2022-07-09 | Discharge: 2022-07-09 | Disposition: A | Discharge status: Home / Self Care | Payer: BC Managed Care – PPO | Attending: Emergency Medicine | Admitting: Emergency Medicine

## 2022-07-09 ENCOUNTER — Encounter (HOSPITAL_COMMUNITY): Payer: Self-pay | Admitting: Emergency Medicine

## 2022-07-09 DIAGNOSIS — R1031 Right lower quadrant pain: Secondary | ICD-10-CM | POA: Insufficient documentation

## 2022-07-09 DIAGNOSIS — R1030 Lower abdominal pain, unspecified: Secondary | ICD-10-CM

## 2022-07-09 DIAGNOSIS — D72829 Elevated white blood cell count, unspecified: Secondary | ICD-10-CM | POA: Diagnosis not present

## 2022-07-09 LAB — COMPREHENSIVE METABOLIC PANEL
ALT: 31 U/L (ref 0–44)
AST: 24 U/L (ref 15–41)
Albumin: 3.5 g/dL (ref 3.5–5.0)
Alkaline Phosphatase: 49 U/L (ref 38–126)
Anion gap: 7 (ref 5–15)
BUN: 6 mg/dL (ref 6–20)
CO2: 21 mmol/L — ABNORMAL LOW (ref 22–32)
Calcium: 8.9 mg/dL (ref 8.9–10.3)
Chloride: 108 mmol/L (ref 98–111)
Creatinine, Ser: 0.65 mg/dL (ref 0.44–1.00)
GFR, Estimated: >60 mL/min (ref >60)
Glucose, Bld: 94 mg/dL (ref 70–99)
Potassium: 3.7 mmol/L (ref 3.5–5.1)
Sodium: 136 mmol/L (ref 135–145)
Total Bilirubin: 0.3 mg/dL (ref 0.3–1.2)
Total Protein: 6.7 g/dL (ref 6.5–8.1)

## 2022-07-09 LAB — CBC WITH DIFFERENTIAL/PLATELET
Abs Immature Granulocytes: 0.04 10*3/uL (ref 0.00–0.07)
Basophils Absolute: 0.1 10*3/uL (ref 0.0–0.1)
Basophils Relative: 1 %
Eosinophils Absolute: 0.2 10*3/uL (ref 0.0–0.5)
Eosinophils Relative: 2 %
HCT: 44.3 % (ref 36.0–46.0)
Hemoglobin: 14.6 g/dL (ref 12.0–15.0)
Immature Granulocytes: 0 %
Lymphocytes Relative: 25 %
Lymphs Abs: 3.1 10*3/uL (ref 0.7–4.0)
MCH: 29.6 pg (ref 26.0–34.0)
MCHC: 33 g/dL (ref 30.0–36.0)
MCV: 89.7 fL (ref 80.0–100.0)
Monocytes Absolute: 0.9 10*3/uL (ref 0.1–1.0)
Monocytes Relative: 7 %
Neutro Abs: 8.2 10*3/uL — ABNORMAL HIGH (ref 1.7–7.7)
Neutrophils Relative %: 65 %
Platelets: 301 10*3/uL (ref 150–400)
RBC: 4.94 MIL/uL (ref 3.87–5.11)
RDW: 13.4 % (ref 11.5–15.5)
WBC: 12.5 10*3/uL — ABNORMAL HIGH (ref 4.0–10.5)
nRBC: 0 % (ref 0.0–0.2)

## 2022-07-09 LAB — URINALYSIS, ROUTINE W REFLEX MICROSCOPIC
Bilirubin Urine: NEGATIVE
Glucose, UA: NEGATIVE mg/dL
Hgb urine dipstick: NEGATIVE
Ketones, ur: NEGATIVE mg/dL
Leukocytes,Ua: NEGATIVE
Nitrite: NEGATIVE
Protein, ur: NEGATIVE mg/dL
Specific Gravity, Urine: 1.02 (ref 1.005–1.030)
pH: 6 (ref 5.0–8.0)

## 2022-07-09 LAB — LIPASE, BLOOD: Lipase: 27 U/L (ref 11–51)

## 2022-07-09 MED ORDER — MORPHINE SULFATE (PF) 4 MG/ML IV SOLN
4.0000 mg | Freq: Once | INTRAVENOUS | Status: AC
  Administered 2022-07-09: 4 mg via INTRAVENOUS
  Filled 2022-07-09: qty 1

## 2022-07-09 MED ORDER — ONDANSETRON HCL 4 MG/2ML IJ SOLN
4.0000 mg | Freq: Once | INTRAMUSCULAR | Status: AC
  Administered 2022-07-09: 4 mg via INTRAVENOUS
  Filled 2022-07-09: qty 2

## 2022-07-09 MED ORDER — IOHEXOL 350 MG/ML SOLN
75.0000 mL | Freq: Once | INTRAVENOUS | Status: AC | PRN
  Administered 2022-07-09: 75 mL via INTRAVENOUS

## 2022-07-09 MED ORDER — SODIUM CHLORIDE 0.9 % IV BOLUS
1000.0000 mL | Freq: Once | INTRAVENOUS | Status: AC
  Administered 2022-07-09: 1000 mL via INTRAVENOUS

## 2022-07-09 MED ORDER — ONDANSETRON 4 MG PO TBDP: ORAL_TABLET | ORAL | 0 refills | Status: AC

## 2022-07-09 NOTE — Discharge Instructions (Addendum)
We are not sure of the cause of your abdominal discomfort.  Luckily we did not find a problem with your appendix or your ovaries.  Please return for worsening pain fever inability eat or drink.  Follow-up with your family doctor in the office.  Take 4 over the counter ibuprofen tablets 3 times a day or 2 over-the-counter naproxen tablets twice a day for pain. Also take tylenol 1000mg (2 extra strength) four times a day.

## 2022-07-09 NOTE — ED Triage Notes (Signed)
Lower abd pain x 3 days. Thought it was constipation and has taken laxatives for several days with no change in abd pain. PT states that any movement at all standing, sitting or touching causes sharp pain. Pain is worse on right lower groin up to rlq.

## 2022-07-09 NOTE — ED Provider Triage Note (Signed)
Emergency Medicine Provider Triage Evaluation Note  Stephanie Peterson , a 37 y.o. female  was evaluated in triage.  Pt complains of lower abdominal pain onset 3 days.  Has a history of similar symptoms.  Notes that her pain is localized to the right lower quadrant region.  Still has her gallbladder and appendix.  Patient still has her ovaries in place.  Patient has taken a laxative for the past couple days due to constipation.  Last bowel movement was today.  Has associated nausea that is resolved at this time.   Review of Systems  Positive:  Negative:   Physical Exam  BP 134/78 (BP Location: Right Arm)   Pulse 95   Temp 98.2 F (36.8 C) (Oral)   Resp 16   Ht 5\' 6"  (1.676 m)   Wt 102.1 kg   LMP 07/23/2018   SpO2 100%   BMI 36.32 kg/m  Gen:   Awake, no distress   Resp:  Normal effort  MSK:   Moves extremities without difficulty  Other:  Mild tenderness to palpation noted to lower abdomen.  Medical Decision Making  Medically screening exam initiated at 5:42 PM.  Appropriate orders placed.  Stephanie Peterson was informed that the remainder of the evaluation will be completed by another provider, this initial triage assessment does not replace that evaluation, and the importance of remaining in the ED until their evaluation is complete.  Workup initiated   Stephanie Peterson A, PA-C 07/09/22 1759

## 2022-07-09 NOTE — ED Provider Notes (Signed)
[facility] Provider Note   CSN: [id number] Arrival date & time: 07/09/22  1649     History  Chief Complaint  Patient presents with   Abdominal Pain    Stephanie Peterson is a 37 y.o. female.  37 yo F with a chief complaint of abdominal pain.  Going on for about 72 hours.  Pain is worse to the lower abdomen worse on the right than the left.  Has a history of a hysterectomy.  Denies prior other prior abdominal surgeries.  Denies fevers denies vomiting.  Worse with movement twisting palpation.   Abdominal Pain      Home Medications Prior to Admission medications   Medication Sig Start Date End Date Taking? Authorizing Provider  ondansetron (ZOFRAN-ODT) 4 MG disintegrating tablet 4mg  ODT q4 hours prn nausea/vomit 07/09/22  Yes Deno Etienne, DO  ibuprofen (ADVIL,MOTRIN) 200 MG tablet Take 800 mg by mouth every 6 (six) hours as needed for pain.    [provider]      Allergies    Amoxicillin, Penicillins, and Ultram [tramadol]    Review of Systems   Review of Systems  Gastrointestinal:  Positive for abdominal pain.    Physical Exam Updated Vital Signs BP 103/61   Pulse 76   Temp 98 F (36.7 C) (Oral)   Resp 18   Ht 5\' 6"  (1.676 m)   Wt 102.1 kg   LMP 07/23/2018   SpO2 97%   BMI 36.32 kg/m  Physical Exam Vitals and nursing note reviewed.  Constitutional:      General: She is not in acute distress.    Appearance: She is well-developed. She is not diaphoretic.  HENT:     Head: Normocephalic and atraumatic.  Eyes:     Pupils: Pupils are equal, round, and reactive to light.  Cardiovascular:     Rate and Rhythm: Normal rate and regular rhythm.     Heart sounds: No murmur heard.    No friction rub. No gallop.  Pulmonary:     Effort: Pulmonary effort is normal.     Breath sounds: No wheezing or rales.  Abdominal:     General: There is no distension.     Palpations: Abdomen is soft.     Tenderness: There  is abdominal tenderness.     Comments: Tenderness worse to the right lower quadrant.  No obvious rebound or guarding.  Negative Rovsing's.  Musculoskeletal:        General: No tenderness.     Cervical back: Normal range of motion and neck supple.  Skin:    General: Skin is warm and dry.  Neurological:     Mental Status: She is alert and oriented to person, place, and time.  Psychiatric:        Behavior: Behavior normal.     ED Results / Procedures / Treatments   Labs (all labs ordered are listed, but only abnormal results are displayed) Labs Reviewed  CBC WITH DIFFERENTIAL/PLATELET - Abnormal; Notable for the following components:      Result Value   WBC 12.5 (*)    Neutro Abs 8.2 (*)    All other components within normal limits  COMPREHENSIVE METABOLIC PANEL - Abnormal; Notable for the following components:   CO2 21 (*)    All other components within normal limits  URINALYSIS, ROUTINE W REFLEX MICROSCOPIC - Abnormal; Notable for the following components:   Color, Urine AMBER (*)    APPearance HAZY (*)  All other components within normal limits  LIPASE, BLOOD    EKG None  Radiology US Pelvis Complete  Result Date: 07/09/2022 CLINICAL DATA:  Right adnexal pain EXAM: TRANSABDOMINAL AND TRANSVAGINAL ULTRASOUND OF PELVIS DOPPLER ULTRASOUND OF OVARIES TECHNIQUE: Both transabdominal and transvaginal ultrasound examinations of the pelvis were performed. Transabdominal technique was performed for global imaging of the pelvis including uterus, ovaries, adnexal regions, and pelvic cul-de-sac. It was necessary to proceed with endovaginal exam following the transabdominal exam to visualize the ovaries. Color and duplex Doppler ultrasound was utilized to evaluate blood flow to the ovaries. COMPARISON:  CT abdomen and pelvis 07/09/2022 FINDINGS: Uterus Surgically absent. Endometrium N/A Right ovary Measurements: 4.6 x 2.9 x 2.7 cm = volume: 19 mL. Normal appearance/no adnexal mass. Left  ovary Measurements: 2.8 x 1.5 x 2.5 cm = volume: 5.5 mL. Normal appearance/no adnexal mass. Pulsed Doppler evaluation of both ovaries demonstrates normal low-resistance arterial and venous waveforms. Other findings There is trace free fluid in the pelvis. IMPRESSION: 1. The ovaries appear within normal limits. 2. Hysterectomy. 3. Trace free fluid in the pelvis. Electronically Signed   By: Ronney Asters M.D.   On: 07/09/2022 22:14   US Transvaginal Non-OB  Result Date: 07/09/2022 CLINICAL DATA:  Right adnexal pain EXAM: TRANSABDOMINAL AND TRANSVAGINAL ULTRASOUND OF PELVIS DOPPLER ULTRASOUND OF OVARIES TECHNIQUE: Both transabdominal and transvaginal ultrasound examinations of the pelvis were performed. Transabdominal technique was performed for global imaging of the pelvis including uterus, ovaries, adnexal regions, and pelvic cul-de-sac. It was necessary to proceed with endovaginal exam following the transabdominal exam to visualize the ovaries. Color and duplex Doppler ultrasound was utilized to evaluate blood flow to the ovaries. COMPARISON:  CT abdomen and pelvis 07/09/2022 FINDINGS: Uterus Surgically absent. Endometrium N/A Right ovary Measurements: 4.6 x 2.9 x 2.7 cm = volume: 19 mL. Normal appearance/no adnexal mass. Left ovary Measurements: 2.8 x 1.5 x 2.5 cm = volume: 5.5 mL. Normal appearance/no adnexal mass. Pulsed Doppler evaluation of both ovaries demonstrates normal low-resistance arterial and venous waveforms. Other findings There is trace free fluid in the pelvis. IMPRESSION: 1. The ovaries appear within normal limits. 2. Hysterectomy. 3. Trace free fluid in the pelvis. Electronically Signed   By: Ronney Asters M.D.   On: 07/09/2022 22:14   [place] Art/Ven Flow Abd Pelv Doppler  Result Date: 07/09/2022 CLINICAL DATA:  Right adnexal pain EXAM: TRANSABDOMINAL AND TRANSVAGINAL ULTRASOUND OF PELVIS DOPPLER ULTRASOUND OF OVARIES TECHNIQUE: Both transabdominal and transvaginal ultrasound examinations of  the pelvis were performed. Transabdominal technique was performed for global imaging of the pelvis including uterus, ovaries, adnexal regions, and pelvic cul-de-sac. It was necessary to proceed with endovaginal exam following the transabdominal exam to visualize the ovaries. Color and duplex Doppler ultrasound was utilized to evaluate blood flow to the ovaries. COMPARISON:  CT abdomen and pelvis 07/09/2022 FINDINGS: Uterus Surgically absent. Endometrium N/A Right ovary Measurements: 4.6 x 2.9 x 2.7 cm = volume: 19 mL. Normal appearance/no adnexal mass. Left ovary Measurements: 2.8 x 1.5 x 2.5 cm = volume: 5.5 mL. Normal appearance/no adnexal mass. Pulsed Doppler evaluation of both ovaries demonstrates normal low-resistance arterial and venous waveforms. Other findings There is trace free fluid in the pelvis. IMPRESSION: 1. The ovaries appear within normal limits. 2. Hysterectomy. 3. Trace free fluid in the pelvis. Electronically Signed   By: Ronney Asters M.D.   On: 07/09/2022 22:14   CT ABDOMEN PELVIS W CONTRAST  Result Date: 07/09/2022 CLINICAL DATA:  Right lower quadrant abdominal pain EXAM:  CT ABDOMEN AND PELVIS WITH CONTRAST TECHNIQUE: Multidetector CT imaging of the abdomen and pelvis was performed using the standard protocol following bolus administration of intravenous contrast. RADIATION DOSE REDUCTION: This exam was performed according to the departmental dose-optimization program which includes automated exposure control, adjustment of the mA and/or kV according to patient size and/or use of iterative reconstruction technique. CONTRAST:  72mL OMNIPAQUE IOHEXOL 350 MG/ML SOLN COMPARISON:  None Available. FINDINGS: Lower chest: No acute abnormality. Hepatobiliary: No focal liver abnormality is seen. No gallstones, gallbladder wall thickening, or biliary dilatation. Pancreas: Unremarkable. No pancreatic ductal dilatation or surrounding inflammatory changes. Spleen: Normal in size without focal  abnormality. Adrenals/Urinary Tract: There several nonobstructing left renal calculi in the superior pole measuring up 2 6 mm. There is no hydronephrosis or perinephric fluid. The adrenal glands, right kidney and bladder are within normal limits. Stomach/Bowel: Stomach is within normal limits. Appendix appears normal. No evidence of bowel wall thickening, distention, or inflammatory changes. There is sigmoid colon diverticulosis. Vascular/Lymphatic: No significant vascular findings are present. No enlarged abdominal or pelvic lymph nodes. Reproductive: The right ovary is prominent in size. Left ovary is normal in size. The uterus is not visualized. Other: There is a small amount of free fluid in the pelvis. There is no focal abdominal wall hernia. Musculoskeletal: No fracture is seen. Degenerative changes are seen at L5-S1. IMPRESSION: 1. Small amount of free fluid in the pelvis. 2. Prominent right ovary. Recommend further evaluation with pelvic ultrasound. 3. Nonobstructing left renal calculi. 4. Sigmoid colon diverticulosis. Electronically Signed   By: Darliss Cheney M.D.   On: 07/09/2022 21:00    Procedures Procedures    Medications Ordered in ED Medications  morphine (PF) 4 MG/ML injection 4 mg (4 mg Intravenous Given 07/09/22 2014)  ondansetron (ZOFRAN) injection 4 mg (4 mg Intravenous Given 07/09/22 2013)  sodium chloride 0.9 % bolus 1,000 mL (0 mLs Intravenous Stopped 07/09/22 2211)  iohexol (OMNIPAQUE) 350 MG/ML injection 75 mL (75 mLs Intravenous Contrast Given 07/09/22 2053)  morphine (PF) 4 MG/ML injection 4 mg (4 mg Intravenous Given 07/09/22 2212)  ondansetron (ZOFRAN) injection 4 mg (4 mg Intravenous Given 07/09/22 2212)    ED Course/ Medical Decision Making/ A&P                             Medical Decision Making Amount and/or Complexity of Data Reviewed Radiology: ordered.  Risk Prescription drug management.   37 yo F with a chief complaints of abdominal pain, going on for about  3 days now.  Worse to the right lower quadrant.  She has mild leukocytosis.  Is afebrile here.  UA negative for infection.  Denies any vaginal symptoms.  Will obtain CT imaging.  Reassess.  CT scan is concerning for possible right ovarian fullness.  Radiology recommending pelvic ultrasound.  History not consistent with torsion.  Pelvic ultrasound also not consistent with torsion.  Feeling bit better.  Tolerating by mouth.  Will discharge home.  PCP follow-up.  10:22 PM:  I have discussed the diagnosis/risks/treatment options with the patient and family.  Evaluation and diagnostic testing in the emergency department does not suggest an emergent condition requiring admission or immediate intervention beyond what has been performed at this time.  They will follow up with PCP. We also discussed returning to the ED immediately if new or worsening sx occur. We discussed the sx which are most concerning (e.g., sudden worsening pain, fever, inability to  tolerate by mouth) that necessitate immediate return. Medications administered to the patient during their visit and any new prescriptions provided to the patient are listed below.  Medications given during this visit Medications  morphine (PF) 4 MG/ML injection 4 mg (4 mg Intravenous Given 07/09/22 2014)  ondansetron (ZOFRAN) injection 4 mg (4 mg Intravenous Given 07/09/22 2013)  sodium chloride 0.9 % bolus 1,000 mL (0 mLs Intravenous Stopped 07/09/22 2211)  iohexol (OMNIPAQUE) 350 MG/ML injection 75 mL (75 mLs Intravenous Contrast Given 07/09/22 2053)  morphine (PF) 4 MG/ML injection 4 mg (4 mg Intravenous Given 07/09/22 2212)  ondansetron (ZOFRAN) injection 4 mg (4 mg Intravenous Given 07/09/22 2212)     The patient appears reasonably screen and/or stabilized for discharge and I doubt any other medical condition or other [facility] requiring further screening, evaluation, or treatment in the ED at this time prior to discharge.          Final Clinical  Impression(s) / ED Diagnoses Final diagnoses:  Lower abdominal pain    Rx / DC Orders ED Discharge Orders          Ordered    ondansetron (ZOFRAN-ODT) 4 MG disintegrating tablet        07/09/22 2221              Melene Plan, DO 07/09/22 2222

## 2022-07-16 ENCOUNTER — Telehealth: Payer: Self-pay | Admitting: *Deleted

## 2022-07-16 NOTE — Patient Outreach (Signed)
  Care Coordination [facility] Note ED EMMI Alert Transition Care Management Unsuccessful Follow-up Telephone Call  Date of discharge and from where:  [place] on 07/09/22  Attempts:  1st Attempt  Reason for unsuccessful TCM follow-up call:  Left voice message and sent my chart message with information on how to schedule a new patient visit.  Chong Sicilian, BSN, RN-BC RN Care Coordinator [place] Direct Dial: [phone] Main #: [phone]

## 2022-07-17 ENCOUNTER — Telehealth: Payer: Self-pay | Admitting: *Deleted

## 2022-07-17 NOTE — Patient Outreach (Signed)
  Care Coordination [facility] Note ED EMMI Alert Transition Care Management Unsuccessful Follow-up Telephone Call  Date of discharge and from where:  [place] on 07/09/22  Attempts:  2nd Attempt and My Chart Message sent yesterday  Reason for unsuccessful TCM follow-up call:  No answer/busy  Chong Sicilian, BSN, RN-BC RN Care Coordinator [place]: [phone] Main #: [phone]

## 2022-07-18 ENCOUNTER — Telehealth: Payer: Self-pay | Admitting: *Deleted

## 2022-07-18 NOTE — Patient Outreach (Signed)
  Care Coordination [facility] Note ED EMMI Alert Transition Care Management Unsuccessful Follow-up Telephone Call  Date of discharge and from where:  [place] on 07/09/22  Attempts:  3rd Attempt  Reason for unsuccessful TCM follow-up call:  No answer/busy. My Chart message was sent after initial unsuccessful outreach attempt. It appears that the message has been read. It included information on how to find and schedule an appointment with a PCP.   Chong Sicilian, BSN, RN-BC RN Care Coordinator [place] Direct Dial: [phone] Main #: [phone]

## 2023-10-14 DIAGNOSIS — Z0001 Encounter for general adult medical examination with abnormal findings: Secondary | ICD-10-CM | POA: Diagnosis not present

## 2024-03-03 DIAGNOSIS — J1189 Influenza due to unidentified influenza virus with other manifestations: Secondary | ICD-10-CM | POA: Diagnosis not present

## 2024-03-03 DIAGNOSIS — Z20828 Contact with and (suspected) exposure to other viral communicable diseases: Secondary | ICD-10-CM | POA: Diagnosis not present
